# Patient Record
Sex: Female | Born: 1985 | Race: White | Hispanic: No | Marital: Married | Smoking: Former smoker
Health system: Southern US, Community
[De-identification: ages and names within clinical notes are randomized; demographics above are authoritative.]

## PROBLEM LIST (undated history)

## (undated) DIAGNOSIS — G43909 Migraine, unspecified, not intractable, without status migrainosus: Secondary | ICD-10-CM

## (undated) HISTORY — PX: CHOLECYSTECTOMY: SHX55

## (undated) HISTORY — PX: APPENDECTOMY: SHX54

## (undated) HISTORY — PX: DILATION AND EVACUATION: SHX1459

---

## 2015-03-10 NOTE — L&D Delivery Note (Signed)
Delivery Note At 8:26 PM a viable female was delivered via Vaginal, Spontaneous Delivery (Presentation: Left Occiput Anterior).  APGAR: 8, 9; weight  .   Placenta status: Intact, Spontaneous.  Cord: 3 vessels with the following complications: CAN x1 (reduced on perineum) and body cord x1.   Anesthesia: Local Other (Dermaplast) Episiotomy: None Lacerations: 1st degree left (extending from periurethral area to introitus) and right labial Suture Repair: 3.0 chromic Est. Blood Loss (mL): 400  Mom to postpartum.  Baby to Couplet care / Skin to Skin.  Farrel ConnersGUTIERREZ, Mackenzie Ellis 08/17/2015, 9:33 PM

## 2015-08-17 ENCOUNTER — Inpatient Hospital Stay
Admission: EM | Admit: 2015-08-17 | Discharge: 2015-08-19 | DRG: 775 | Disposition: A | Discharge status: Home / Self Care | Payer: BC Managed Care – PPO | Attending: Certified Nurse Midwife | Admitting: Certified Nurse Midwife

## 2015-08-17 DIAGNOSIS — O9912 Other diseases of the blood and blood-forming organs and certain disorders involving the immune mechanism complicating childbirth: Secondary | ICD-10-CM | POA: Diagnosis present

## 2015-08-17 DIAGNOSIS — O99824 Streptococcus B carrier state complicating childbirth: Secondary | ICD-10-CM | POA: Diagnosis present

## 2015-08-17 DIAGNOSIS — Z3A38 38 weeks gestation of pregnancy: Secondary | ICD-10-CM

## 2015-08-17 HISTORY — DX: Migraine, unspecified, not intractable, without status migrainosus: G43.909

## 2015-08-17 LAB — COMPREHENSIVE METABOLIC PANEL
ALK PHOS: 157 U/L — AB (ref 38–126)
ALT: 14 U/L (ref 14–54)
ANION GAP: 12 (ref 5–15)
AST: 19 U/L (ref 15–41)
Albumin: 3.4 g/dL — ABNORMAL LOW (ref 3.5–5.0)
BUN: 6 mg/dL (ref 6–20)
CALCIUM: 9.5 mg/dL (ref 8.9–10.3)
CO2: 19 mmol/L — AB (ref 22–32)
Chloride: 103 mmol/L (ref 101–111)
Creatinine, Ser: 0.68 mg/dL (ref 0.44–1.00)
GFR calc non Af Amer: >60 mL/min (ref >60)
Glucose, Bld: 90 mg/dL (ref 65–99)
POTASSIUM: 3.6 mmol/L (ref 3.5–5.1)
SODIUM: 134 mmol/L — AB (ref 135–145)
Total Bilirubin: 1.1 mg/dL (ref 0.3–1.2)
Total Protein: 6.5 g/dL (ref 6.5–8.1)

## 2015-08-17 LAB — CBC
HEMATOCRIT: 38.8 % (ref 35.0–47.0)
HEMOGLOBIN: 13 g/dL (ref 12.0–16.0)
MCH: 29.7 pg (ref 26.0–34.0)
MCHC: 33.5 g/dL (ref 32.0–36.0)
MCV: 88.6 fL (ref 80.0–100.0)
Platelets: 96 10*3/uL — ABNORMAL LOW (ref 150–440)
RBC: 4.38 MIL/uL (ref 3.80–5.20)
RDW: 13.4 % (ref 11.5–14.5)
WBC: 16.8 10*3/uL — ABNORMAL HIGH (ref 3.6–11.0)

## 2015-08-17 LAB — TYPE AND SCREEN
ABO/RH(D): O POS
ANTIBODY SCREEN: NEGATIVE

## 2015-08-17 MED ORDER — SODIUM CHLORIDE FLUSH 0.9 % IV SOLN
INTRAVENOUS | Status: AC
  Filled 2015-08-17: qty 20

## 2015-08-17 MED ORDER — IBUPROFEN 600 MG PO TABS
600.0000 mg | ORAL_TABLET | Freq: Four times a day (QID) | ORAL | Status: DC | PRN
  Filled 2015-08-17 (×2): qty 1

## 2015-08-17 MED ORDER — ONDANSETRON HCL 4 MG/2ML IJ SOLN
INTRAMUSCULAR | Status: AC
  Administered 2015-08-17: 4 mg via INTRAVENOUS
  Filled 2015-08-17: qty 2

## 2015-08-17 MED ORDER — LACTATED RINGERS IV BOLUS (SEPSIS)
500.0000 mL | Freq: Once | INTRAVENOUS | Status: AC
  Administered 2015-08-17: 500 mL via INTRAVENOUS

## 2015-08-17 MED ORDER — DOCUSATE SODIUM 100 MG PO CAPS
100.0000 mg | ORAL_CAPSULE | Freq: Every day | ORAL | Status: DC
  Administered 2015-08-18 – 2015-08-19 (×2): 100 mg via ORAL
  Filled 2015-08-17 (×2): qty 1

## 2015-08-17 MED ORDER — OXYTOCIN 10 UNIT/ML IJ SOLN
INTRAMUSCULAR | Status: AC
  Filled 2015-08-17: qty 2

## 2015-08-17 MED ORDER — SIMETHICONE 80 MG PO CHEW: 80.0000 mg | CHEWABLE_TABLET | ORAL | Status: DC | PRN

## 2015-08-17 MED ORDER — BENZOCAINE-MENTHOL 20-0.5 % EX AERO
1.0000 "application " | INHALATION_SPRAY | CUTANEOUS | Status: DC | PRN
  Administered 2015-08-18: 1 via TOPICAL

## 2015-08-17 MED ORDER — ONDANSETRON HCL 4 MG PO TABS: 4.0000 mg | ORAL_TABLET | ORAL | Status: DC | PRN

## 2015-08-17 MED ORDER — LACTATED RINGERS IV SOLN: 500.0000 mL | INTRAVENOUS | Status: DC | PRN

## 2015-08-17 MED ORDER — FERROUS SULFATE 325 (65 FE) MG PO TABS
325.0000 mg | ORAL_TABLET | Freq: Every day | ORAL | Status: DC
  Administered 2015-08-18 – 2015-08-19 (×2): 325 mg via ORAL
  Filled 2015-08-17 (×2): qty 1

## 2015-08-17 MED ORDER — ONDANSETRON HCL 4 MG/2ML IJ SOLN
4.0000 mg | Freq: Four times a day (QID) | INTRAMUSCULAR | Status: DC | PRN
  Administered 2015-08-17: 4 mg via INTRAVENOUS

## 2015-08-17 MED ORDER — OXYTOCIN BOLUS FROM INFUSION: 500.0000 mL | INTRAVENOUS | Status: DC

## 2015-08-17 MED ORDER — LIDOCAINE HCL (PF) 1 % IJ SOLN: 30.0000 mL | INTRAMUSCULAR | Status: DC | PRN

## 2015-08-17 MED ORDER — MISOPROSTOL 200 MCG PO TABS
ORAL_TABLET | ORAL | Status: DC
Start: 2015-08-17 — End: 2015-08-17
  Filled 2015-08-17: qty 4

## 2015-08-17 MED ORDER — BENZOCAINE-MENTHOL 20-0.5 % EX AERO
INHALATION_SPRAY | CUTANEOUS | Status: AC
  Administered 2015-08-18: 1 via TOPICAL
  Filled 2015-08-17: qty 56

## 2015-08-17 MED ORDER — ONDANSETRON HCL 4 MG/2ML IJ SOLN: 4.0000 mg | INTRAMUSCULAR | Status: DC | PRN

## 2015-08-17 MED ORDER — FENTANYL 2.5 MCG/ML W/ROPIVACAINE 0.2% IN NS 100 ML EPIDURAL INFUSION (ARMC-ANES)
EPIDURAL | Status: AC
  Filled 2015-08-17: qty 100

## 2015-08-17 MED ORDER — AMMONIA AROMATIC IN INHA: 0.3000 mL | Freq: Once | RESPIRATORY_TRACT | Status: DC | PRN

## 2015-08-17 MED ORDER — AMPICILLIN SODIUM 2 G IJ SOLR
2.0000 g | Freq: Once | INTRAMUSCULAR | Status: AC
  Administered 2015-08-17: 2 g via INTRAVENOUS
  Filled 2015-08-17: qty 2000

## 2015-08-17 MED ORDER — AMMONIA AROMATIC IN INHA
RESPIRATORY_TRACT | Status: AC
  Filled 2015-08-17: qty 10

## 2015-08-17 MED ORDER — MISOPROSTOL 200 MCG PO TABS
800.0000 ug | ORAL_TABLET | Freq: Once | ORAL | Status: DC | PRN
  Filled 2015-08-17: qty 4

## 2015-08-17 MED ORDER — DIBUCAINE 1 % RE OINT: 1.0000 "application " | TOPICAL_OINTMENT | RECTAL | Status: DC | PRN

## 2015-08-17 MED ORDER — LIDOCAINE HCL (PF) 1 % IJ SOLN
INTRAMUSCULAR | Status: AC
  Filled 2015-08-17: qty 30

## 2015-08-17 MED ORDER — OXYTOCIN 40 UNITS IN LACTATED RINGERS INFUSION - SIMPLE MED
2.5000 [IU]/h | INTRAVENOUS | Status: DC
  Administered 2015-08-17: 2.5 [IU]/h via INTRAVENOUS
  Filled 2015-08-17: qty 1000

## 2015-08-17 MED ORDER — SODIUM CHLORIDE 0.9 % IV SOLN
1.0000 g | INTRAVENOUS | Status: DC
  Administered 2015-08-17: 1 g via INTRAVENOUS
  Filled 2015-08-17 (×5): qty 1000

## 2015-08-17 MED ORDER — OXYCODONE HCL 5 MG PO TABS: 5.0000 mg | ORAL_TABLET | ORAL | Status: DC | PRN

## 2015-08-17 MED ORDER — COCONUT OIL OIL
1.0000 "application " | TOPICAL_OIL | Status: DC | PRN
  Filled 2015-08-17: qty 120

## 2015-08-17 MED ORDER — PRENATAL MULTIVITAMIN CH
1.0000 | ORAL_TABLET | Freq: Every day | ORAL | Status: DC
  Administered 2015-08-18: 1 via ORAL
  Filled 2015-08-17 (×2): qty 1

## 2015-08-17 MED ORDER — LACTATED RINGERS IV SOLN: INTRAVENOUS | Status: DC

## 2015-08-17 MED ORDER — WITCH HAZEL-GLYCERIN EX PADS: 1.0000 "application " | MEDICATED_PAD | CUTANEOUS | Status: DC | PRN

## 2015-08-17 NOTE — Progress Notes (Signed)
L&D Progress Note   S: Had used the N2O2 for pain but did not feel it was helping her. Asked for a epidural  O: 147/96 FHR: 145 with accelerations to 170s, moderate variability Toco: q2-4 min apart with some coupling Cervix: 7/90%/-1 Results for orders placed or performed during the hospital encounter of 08/17/15 (from the past 24 hour(s))  Comprehensive metabolic panel     Status: Abnormal   Collection Time: 08/17/15  1:10 PM  Result Value Ref Range   Sodium 134 (L) 135 - 145 mmol/L   Potassium 3.6 3.5 - 5.1 mmol/L   Chloride 103 101 - 111 mmol/L   CO2 19 (L) 22 - 32 mmol/L   Glucose, Bld 90 65 - 99 mg/dL   BUN 6 6 - 20 mg/dL   Creatinine, Ser 1.610.68 0.44 - 1.00 mg/dL   Calcium 9.5 8.9 - 09.610.3 mg/dL   Total Protein 6.5 6.5 - 8.1 g/dL   Albumin 3.4 (L) 3.5 - 5.0 g/dL   AST 19 15 - 41 U/L   ALT 14 14 - 54 U/L   Alkaline Phosphatase 157 (H) 38 - 126 U/L   Total Bilirubin 1.1 0.3 - 1.2 mg/dL   GFR calc non Af Amer >60 >60 mL/min   GFR calc Af Amer >60 >60 mL/min   Anion gap 12 5 - 15  CBC     Status: Abnormal   Collection Time: 08/17/15  2:02 PM  Result Value Ref Range   WBC 16.8 (H) 3.6 - 11.0 K/uL   RBC 4.38 3.80 - 5.20 MIL/uL   Hemoglobin 13.0 12.0 - 16.0 g/dL   HCT 04.538.8 40.935.0 - 81.147.0 %   MCV 88.6 80.0 - 100.0 fL   MCH 29.7 26.0 - 34.0 pg   MCHC 33.5 32.0 - 36.0 g/dL   RDW 91.413.4 78.211.5 - 95.614.5 %   Platelets 96 (L) 150 - 440 K/uL  Type and screen [facility]     Status: None   Collection Time: 08/17/15  2:02 PM  Result Value Ref Range   ABO/RH(D) O POS    Antibody Screen NEG    Sample Expiration 08/20/2015    A: Progressing in active labor Mild thrombocytopenia Mildly elevated blood pressures-possible gestational hypertension Cat 1 FHR tracing  P: Anesthesia consulted for epidural, but declined doing epidural due to plt count<100K. All other preeclampsia labs were WNL. Patient declines analgesia Monitor BPs closely for severe range blood  pressures.me Farrel ConnersGUTIERREZ, Jaiyah Beining, CNM

## 2015-08-17 NOTE — OB Triage Note (Signed)
G1P0 patient came in with contractions every 2-3 mins per patient. Ctxs began around 6am this morning. Reports positive fetal movement.

## 2015-08-17 NOTE — H&P (Signed)
OB History & Physical   History of Present Illness:  Chief Complaint:  Complains of strong regular contractions since 0600 this Am. Has also had nausea and vomiting this AM. Has not eaten since last night. HPI:  Kelly SplinterKatherine Mcclatchy is a 30 y.o. G2P0010 female at 4970w1d dated by LMP and c/w a 7wk3d ultrasound.  Her pregnancy has been complicated by an elevated one hour GTT with a normal 3hr GTT and GBS positive..  She presents to L&D for evaluation of labor. Has had a bloody show, but no frank bleeding or ROM.  Prenatal care site: [facility]. Plans on breast feeding. TDAP UTD      Maternal Medical History:   Past Medical History  Diagnosis Date  . Migraine     Past Surgical History  Procedure Laterality Date  . Appendectomy    . Dilation and evacuation      EAB in second trimester    No Known Allergies  Prior to Admission medications   Medication Sig Start Date End Date Taking? Authorizing Provider  Prenatal Vit-Fe Fumarate-FA (PRENATAL MULTIVITAMIN) TABS tablet Take 1 tablet by mouth daily at 12 noon.   Yes Historical Provider, MD          Social History: She  reports that she has never smoked. She does not have any smokeless tobacco history on file. She reports that she does not drink alcohol or use illicit drugs.  Family History: family history is not on file.   Review of Systems: Negative x 10 systems reviewed except as noted in the HPI.      Physical Exam:  Vital Signs: BP 135/93 mmHg  Pulse 80  Temp(Src) 98.3 F (36.8 C) (Oral)  Resp 18  Ht 5\' 7"  (1.702 m)  Wt 73.936 kg (163 lb)  BMI 25.52 kg/m2  LMP 11/23/2014 (LMP Unknown) General: no acute distress.  HEENT: normocephalic, atraumatic Heart: regular rate & rhythm.  No murmurs Lungs: clear to auscultation bilaterally Abdomen: soft, gravid, non-tender;  EFW: 7# Pelvic:   External: Normal external female genitalia  Cervix: Dilation: 4.5 / Effacement (%): 90 / Station: -1   Extremities:  non-tender, symmetric, no edema bilaterally.  DTRs: +2 Neurologic: Alert & oriented x 3.    Pertinent Results:  Prenatal Labs: Blood type/Rh O positive  Antibody screen negative  Rubella Varicella Immune immune  RPR negative  HBsAg negative  HIV negative  GC negative  Chlamydia negative  Genetic screening declined  1 hour GTT 157  3 hour GTT 77/158/137/121  GBS positive on 5/26   Baseline FHR: 155 with accelerations to 170s, moderate variability Toco: contractions q2 min apart  Assessment:  Kelly SplinterKatherine Dudzik is a 30 y.o. G2P0010 female at 6270w1d in active labor  Mild range blood pressure on admission-possibly from pain, R/O GHTN vs Preeclampsia  Plan:  1. Admit to Labor & Delivery   2. CBC, T&S, Clrs, IVF, CMP 3. GBS positive-start GBS PPX.   4. Consents obtained. 5. Discussed analgesia and anesthesia available-wants to try to deliver without medication. 6. Can ambulate/ sit in birthing ball, take shower  Armelia Penton  08/17/2015 12:59 PM

## 2015-08-18 LAB — CBC
HEMATOCRIT: 35.8 % (ref 35.0–47.0)
HEMOGLOBIN: 12.2 g/dL (ref 12.0–16.0)
MCH: 30.2 pg (ref 26.0–34.0)
MCHC: 34 g/dL (ref 32.0–36.0)
MCV: 88.9 fL (ref 80.0–100.0)
Platelets: 124 10*3/uL — ABNORMAL LOW (ref 150–440)
RBC: 4.03 MIL/uL (ref 3.80–5.20)
RDW: 13.1 % (ref 11.5–14.5)
WBC: 24.4 10*3/uL — ABNORMAL HIGH (ref 3.6–11.0)

## 2015-08-18 LAB — RPR: RPR: NONREACTIVE

## 2015-08-18 NOTE — Discharge Instructions (Signed)
Vaginal Delivery, Care After Refer to this sheet in the next few weeks. These discharge instructions provide you with information on caring for yourself after delivery. Your caregiver may also give you specific instructions. Your treatment has been planned according to the most current medical practices available, but problems sometimes occur. Call your caregiver if you have any problems or questions after you go home. HOME CARE INSTRUCTIONS 1. Take over-the-counter or prescription medicines only as directed by your caregiver or pharmacist. 2. Do not drink alcohol, especially if you are breastfeeding or taking medicine to relieve pain. 3. Do not smoke tobacco. 4. Continue to use good perineal care. Good perineal care includes: 1. Wiping your perineum from back to front 2. Keeping your perineum clean. 3. You can do sitz baths twice a day, to help keep this area clean 5. Do not use tampons, douche or have sex until your caregiver says it is okay. 6. Shower only and avoid sitting in submerged water, aside from sitz baths 7. Wear a well-fitting bra that provides breast support. 8. Eat healthy foods. 9. Drink enough fluids to keep your urine clear or pale yellow. 10. Eat high-fiber foods such as whole grain cereals and breads, brown rice, beans, and fresh fruits and vegetables every day. These foods may help prevent or relieve constipation. 11. Avoid constipation with high fiber foods or medications, such as miralax or metamucil 12. Follow your caregiver's recommendations regarding resumption of activities such as climbing stairs, driving, lifting, exercising, or traveling. 13. Talk to your caregiver about resuming sexual activities. Resumption of sexual activities is dependent upon your risk of infection, your rate of healing, and your comfort and desire to resume sexual activity. 14. Try to have someone help you with your household activities and your newborn for at least a few days after you leave  the hospital. 15. Rest as much as possible. Try to rest or take a nap when your newborn is sleeping. 16. Increase your activities gradually. 17. Keep all of your scheduled postpartum appointments. It is very important to keep your scheduled follow-up appointments. At these appointments, your caregiver will be checking to make sure that you are healing physically and emotionally. SEEK MEDICAL CARE IF:   You are passing large clots from your vagina. Save any clots to show your caregiver.  You have a foul smelling discharge from your vagina.  You have trouble urinating.  You are urinating frequently.  You have pain when you urinate.  You have a change in your bowel movements.  You have increasing redness, pain, or swelling near your vaginal incision (episiotomy) or vaginal tear.  You have pus draining from your episiotomy or vaginal tear.  Your episiotomy or vaginal tear is separating.  You have painful, hard, or reddened breasts.  You have a severe headache.  You have blurred vision or see spots.  You feel sad or depressed.  You have thoughts of hurting yourself or your newborn.  You have questions about your care, the care of your newborn, or medicines.  You are dizzy or light-headed.  You have a rash.  You have nausea or vomiting.  You were breastfeeding and have not had a menstrual period within 12 weeks after you stopped breastfeeding.  You are not breastfeeding and have not had a menstrual period by the 12th week after delivery.  You have a fever. SEEK IMMEDIATE MEDICAL CARE IF:   You have persistent pain.  You have chest pain.  You have shortness of breath.    You faint.  You have leg pain.  You have stomach pain.  Your vaginal bleeding saturates two or more sanitary pads in 1 hour. MAKE SURE YOU:   Understand these instructions.  Will watch your condition.  Will get help right away if you are not doing well or get worse. Document Released:  02/21/2000 Document Revised: 07/10/2013 Document Reviewed: 10/21/2011 ExitCare Patient Information 2015 [employer]. This information is not intended to replace advice given to you by your health care provider. Make sure you discuss any questions you have with your health care provider.  Sitz Bath A sitz bath is a warm water bath taken in the sitting position. The water covers only the hips and butt (buttocks). We recommend using one that fits in the toilet, to help with ease of use and cleanliness. It may be used for either healing or cleaning purposes. Sitz baths are also used to relieve pain, itching, or muscle tightening (spasms). The water may contain medicine. Moist heat will help you heal and relax.  HOME CARE  Take 3 to 4 sitz baths a day. 18. Fill the bathtub half-full with warm water. 19. Sit in the water and open the drain a little. 20. Turn on the warm water to keep the tub half-full. Keep the water running constantly. 21. Soak in the water for 15 to 20 minutes. 22. After the sitz bath, pat the affected area dry. GET HELP RIGHT AWAY IF: You get worse instead of better. Stop the sitz baths if you get worse. MAKE SURE YOU:  Understand these instructions.  Will watch your condition.  Will get help right away if you are not doing well or get worse. Document Released: 04/02/2004 Document Revised: 11/18/2011 Document Reviewed: 06/23/2010 ExitCare Patient Information 2015 [employer]. This information is not intended to replace advice given to you by your health care provider. Make sure you discuss any questions you have with your health care provider.    

## 2015-08-18 NOTE — Progress Notes (Signed)
L&D Progress Note Post Partum Day1 Subjective: no complaints, up ad lib, voiding, tolerating PO and breastfeeding baby  Objective: Blood pressure 106/72, pulse 73, temperature 98.1 F (36.7 C), temperature source Oral, resp. rate 20, height 5\' 7"  (1.702 m), weight 73.936 kg (163 lb), last menstrual period 11/23/2014, SpO2 98 %, unknown if currently breastfeeding.  Physical Exam:  General: alert, cooperative and no distress Lochia: appropriate Uterine Fundus: firm Incision:no hematoma, healing well DVT Evaluation: No evidence of DVT seen on physical exam.   Recent Labs  08/17/15 1402 08/18/15 0505  HGB 13.0 12.2  HCT 38.8 35.8  WBC 16.8* 24.4*  PLT 96* 124*    Assessment/Plan: Stable PPD #1 Plan for discharge tomorrow  Continue postpartum care O POS/ RI/ VI Breast/ [place] TDAP UTD  Mackenzie Ellis, CNM   LOS: 1 day   Mackenzie Ellis 08/18/2015, 1:03 PM

## 2015-08-18 NOTE — Discharge Summary (Signed)
  Physician Obstetric Discharge Summary  Patient ID: Mackenzie Ellis MRN: [id number] DOB/AGE: [birth year] 32 y.o.   Date of Admission: 08/17/2015  Date of Discharge:   Admitting Diagnosis: Onset of Labor at 226w1d  Secondary Diagnosis: None  Mode of Delivery: normal spontaneous vaginal delivery 08/17/2015      Discharge Diagnosis: term pregnancy delivered, gestational thrombocytopenia   Intrapartum Procedures: GBS prophylaxis/repair of labial lacerations bilaterally   Post partum procedures: Routine  Complications: none   Brief Hospital Course  Mackenzie SplinterKatherine Cosgriff is a [id number] who had a SVD on 08/17/2015;  for further details of this delivery, please refer to the delivery note.  Patient had an uncomplicated postpartum course.  By time of discharge on PPD#2, her pain was controlled on oral pain medications; she had appropriate lochia and was ambulating, voiding without difficulty and tolerating regular diet.  She was deemed stable for discharge to home.    *Labs: CBC Latest Ref Rng 08/18/2015 08/17/2015  WBC 3.6 - 11.0 K/uL 24.4(H) 16.8(H)  Hemoglobin 12.0 - 16.0 g/dL 44.012.2 10.213.0  Hematocrit 72.535.0 - 47.0 % 35.8 38.8  Platelets 150 - 440 K/uL 124(L) 96(L)   O POS  Physical exam:  Blood pressure 116/76, pulse 76, temperature 98.8 F (37.1 C), temperature source Oral, resp. rate 18, height 5\' 7"  (1.702 m), weight 73.936 kg (163 lb), last menstrual period 11/23/2014, SpO2 98 %, unknown if currently breastfeeding. General: alert and no distress Lochia: appropriate Abdomen: soft, NT Uterine Fundus: firm Extremities: No evidence of DVT seen on physical exam. No lower extremity edema.  Discharge Instructions: Per After Visit Summary. Activity: Advance as tolerated. Pelvic rest for 6 weeks.  Also refer to Discharge Instructions Diet: Regular Medications:   Medication List    ASK your doctor about these medications        prenatal multivitamin Tabs tablet  Take 1 tablet by mouth daily  at 12 noon.          Birth Control Pill, Start in 2 weeks (June 26).  Outpatient follow up:  Postpartum contraception: oral progesterone-only contraceptive  Discharged Condition: good  Discharged to: home   Newborn Data: Disposition:home with mother  Apgars: APGAR (1 MIN): 8   APGAR (5 MINS): 9   Baby Feeding: Breast  Mackenzie Ellis, Mackenzie Ellis, CNM 08/18/2015 10:17 AM  Mackenzie MajorPaul Harris, MD

## 2015-08-19 MED ORDER — NORETHINDRONE 0.35 MG PO TABS: 1.0000 | ORAL_TABLET | Freq: Every day | ORAL | Status: DC

## 2015-08-19 NOTE — Progress Notes (Signed)
L&D Progress Note Post Partum Day2 Subjective: no complaints, up ad lib, voiding, tolerating PO and breastfeeding baby  Objective: Blood pressure 113/70, pulse 71, temperature 98 F (36.7 C), temperature source Oral, resp. rate 18, height 5\' 7"  (1.702 m), weight 73.936 kg (163 lb), last menstrual period 11/23/2014, SpO2 100 %, unknown if currently breastfeeding.  Physical Exam:  General: alert, cooperative and no distress Lochia: appropriate Uterine Fundus: firm Incision:no hematoma, healing well DVT Evaluation: No evidence of DVT seen on physical exam.   Recent Labs  08/17/15 1402 08/18/15 0505  HGB 13.0 12.2  HCT 38.8 35.8  WBC 16.8* 24.4*  PLT 96* 124*    Assessment/Plan: Stable PPD #2 Continue postpartum care O POS/ RI/ VI Breast/ [place] TDAP UTD [facility]  Letitia Libraobert Paul Harris, MD   LOS: 2 days   Letitia LibraRobert Paul Harris 08/19/2015, 9:43 AM

## 2015-08-19 NOTE — Progress Notes (Signed)
Discharged instructions reviewed with pt and husband.

## 2015-08-19 NOTE — Progress Notes (Signed)
Discharged to home; to car via auxillary; Rx. Given to patient

## 2015-11-25 ENCOUNTER — Ambulatory Visit
Admission: RE | Admit: 2015-11-25 | Discharge: 2015-11-25 | Disposition: A | Discharge status: Home / Self Care | Payer: BC Managed Care – PPO | Source: Ambulatory Visit | Attending: Counselor | Admitting: Counselor

## 2015-11-25 ENCOUNTER — Other Ambulatory Visit: Payer: Self-pay | Admitting: Counselor

## 2015-11-25 DIAGNOSIS — N3289 Other specified disorders of bladder: Secondary | ICD-10-CM | POA: Diagnosis not present

## 2015-11-25 DIAGNOSIS — N200 Calculus of kidney: Secondary | ICD-10-CM | POA: Diagnosis not present

## 2015-11-25 DIAGNOSIS — R109 Unspecified abdominal pain: Secondary | ICD-10-CM

## 2015-11-25 DIAGNOSIS — R319 Hematuria, unspecified: Secondary | ICD-10-CM

## 2015-11-29 ENCOUNTER — Ambulatory Visit (INDEPENDENT_AMBULATORY_CARE_PROVIDER_SITE_OTHER): Payer: BC Managed Care – PPO | Admitting: Urology

## 2015-11-29 ENCOUNTER — Encounter: Payer: Self-pay | Admitting: Urology

## 2015-11-29 VITALS — BP 114/77 | HR 71 | Ht 67.0 in | Wt 139.6 lb

## 2015-11-29 DIAGNOSIS — N200 Calculus of kidney: Secondary | ICD-10-CM | POA: Diagnosis not present

## 2015-11-29 LAB — URINALYSIS, COMPLETE
BILIRUBIN UA: NEGATIVE
Glucose, UA: NEGATIVE
KETONES UA: NEGATIVE
Nitrite, UA: NEGATIVE
PH UA: 6 (ref 5.0–7.5)
PROTEIN UA: NEGATIVE
RBC UA: NEGATIVE
SPEC GRAV UA: 1.02 (ref 1.005–1.030)
Urobilinogen, Ur: 0.2 mg/dL (ref 0.2–1.0)

## 2015-11-29 LAB — MICROSCOPIC EXAMINATION: BACTERIA UA: NONE SEEN

## 2015-11-29 NOTE — Progress Notes (Signed)
11/29/2015 9:09 AM   [name] 1985/12/05 [id number]  Referring provider: [facility] [address] [place], [place] [address]  Chief Complaint  Patient presents with  . New Patient (Initial Visit)    Nephrolithiasis     HPI: The patient is a 30 year old female who recently passed ureteral stone. She also has bilateral stones approximate 2 in her right kidney that are 4-5 mm in size was 1 left kidney.  She passed her stone as it for analysis today. She is never had kidney stones before in the past. She recently had a child approximately 4 months ago. Her father does have a history of nephrolithiasis. She was placed this time. Her pain has resolved.  PMH: Past Medical History:  Diagnosis Date  . Migraine     Surgical History: Past Surgical History:  Procedure Laterality Date  . APPENDECTOMY    . CHOLECYSTECTOMY    . DILATION AND EVACUATION     EAB in second trimester    Home Medications:    Medication List       Accurate as of 11/29/15  9:09 AM. Always use your most recent med list.          norethindrone 0.35 MG tablet Commonly known as:  MICRONOR,CAMILA,ERRIN Take 1 tablet (0.35 mg total) by mouth daily.   prenatal multivitamin Tabs tablet Take 1 tablet by mouth daily at 12 noon.       Allergies: No Known Allergies  Family History: Family History  Problem Relation Age of Onset  . Nephrolithiasis Father   . Hematuria Father   . Diabetes Father   . Prostate cancer Father     Social History:  reports that she quit smoking about 3 years ago. She has never used smokeless tobacco. She reports that she does not drink alcohol or use drugs.  ROS: UROLOGY Frequent Urination?: No Hard to postpone urination?: No Burning/pain with urination?: Yes Get up at night to urinate?: No Leakage of urine?: No Urine stream starts and stops?: No Trouble starting stream?: No Do you have to strain to urinate?: No Blood in urine?:  Yes Urinary tract infection?: No Sexually transmitted disease?: No Injury to kidneys or bladder?: No Painful intercourse?: No Weak stream?: Yes Currently pregnant?: No Vaginal bleeding?: No Last menstrual period?: No  Gastrointestinal Nausea?: Yes Vomiting?: Yes Indigestion/heartburn?: No Diarrhea?: No Constipation?: No  Constitutional Fever: Yes Night sweats?: Yes Weight loss?: No Fatigue?: No  Skin Skin rash/lesions?: No Itching?: No  Eyes Blurred vision?: No Double vision?: No  Ears/Nose/Throat Sore throat?: No Sinus problems?: No  Hematologic/Lymphatic Swollen glands?: No Easy bruising?: No  Cardiovascular Leg swelling?: No Chest pain?: No  Respiratory Cough?: No Shortness of breath?: No  Endocrine Excessive thirst?: No  Musculoskeletal Back pain?: No Joint pain?: No  Neurological Headaches?: Yes Dizziness?: No  Psychologic Depression?: No Anxiety?: No  Physical Exam: BP 114/77   Pulse 71   Ht 5\' 7"  (1.702 m)   Wt 139 lb 9.6 oz (63.3 kg)   LMP 11/08/2014   Breastfeeding? Yes   BMI 21.86 kg/m   Constitutional:  Alert and oriented, No acute distress. HEENT: [place] AT, moist mucus membranes.  Trachea midline, no masses. Cardiovascular: No clubbing, cyanosis, or edema. Respiratory: Normal respiratory effort, no increased work of breathing. GI: Abdomen is soft, nontender, nondistended, no abdominal masses GU: No CVA tenderness.  Skin: No rashes, bruises or suspicious lesions. Lymph: No cervical or inguinal adenopathy. Neurologic: Grossly intact, no focal deficits, moving all  4 extremities. Psychiatric: Normal mood and affect.  Laboratory Data: Lab Results  Component Value Date   WBC 24.4 (H) 08/18/2015   HGB 12.2 08/18/2015   HCT 35.8 08/18/2015   MCV 88.9 08/18/2015   PLT 124 (L) 08/18/2015    Lab Results  Component Value Date   CREATININE 0.68 08/17/2015    No results found for: PSA  No results found for:  TESTOSTERONE  No results found for: HGBA1C  Urinalysis No results found for: COLORURINE, APPEARANCEUR, LABSPEC, PHURINE, GLUCOSEU, HGBUR, BILIRUBINUR, KETONESUR, PROTEINUR, UROBILINOGEN, NITRITE, LEUKOCYTESUR  Pertinent Imaging: CLINICAL DATA:  Right flank pain for 2 days, decreased urine output  EXAM: CT ABDOMEN AND PELVIS WITHOUT CONTRAST  TECHNIQUE: Multidetector CT imaging of the abdomen and pelvis was performed following the standard protocol without IV contrast.  COMPARISON:  None.  FINDINGS: Lower chest: The lung bases are clear.  Hepatobiliary: The liver is unremarkable in the unenhanced state. No ductal dilatation is seen. No calcified gallstones are noted.  Pancreas: The pancreas is normal in size on this unenhanced study and the pancreatic duct is not dilated.  Spleen: The spleen is unremarkable.  Adrenals/Urinary Tract: The adrenal glands appear normal. There are bilateral renal calculi present. The largest cyst in the upper pole of the right kidney measuring 5 mm in diameter. No hydronephrosis is currently seen. However, there is a calcification which on multiplanar images appears to be with in the nondistended urinary bladder. This calculus measures 5 mm in diameter. The ureters are not dilated.  Stomach/Bowel: The stomach is largely decompressed. No dilatation of large or small bowel is seen. There is feces throughout the colon. Surgical clips are noted near the base of the cecum which may be due prior appendectomy.  Vascular/Lymphatic: The abdominal aorta is normal in caliber although are not well seen on this unenhanced study. No adenopathy is noted.  Reproductive: The uterus is not well seen on this unenhanced study. No definite adnexal lesion is noted. A small amount of free fluid layers in the cul de sac.  Other: None  Musculoskeletal: The lumbar vertebrae are in normal alignment with normal intervertebral disc  spaces.  IMPRESSION: 1. There is a 5 mm calcification which appears to be within the urinary bladder consistent with a recently passed calculus. 2. Multiple bilateral renal calculi, the largest in the upper pole of the right kidney measuring 5 mm in diameter. No present hydronephrosis. 3. Small amount of free fluid in the cul-de-sac.  Assessment & Plan:    1. Nephrolithiasis -The patient passed her ureteral calculus. We will send it off for analysis. We will call her with results. -She does have bilateral nonobstructing stones. There are approximately 3 stones in total.She was in her right and left kidney. I discussed options with her which included active surveillance versus definitive management via ureteroscopy. She has a newborn child is not interested in undergoing surgery at this time as she is asymptomatic. She will undergo active surveillance. She does understand the risks include need for emergent treatment if her stones to pass or if she has an infection while passing a stone. I do think active surveillance is a reasonable option given her social needs of caring for her newborn child. She'll follow-up in one year with KUB prior.   Return in about 1 year (around 11/28/2016) for with KUB prior.  Hildred LaserBrian James Airi Copado, MD  [facility] [address], [address] [place], [place] [address] [phone]

## 2015-12-02 LAB — CULTURE, URINE COMPREHENSIVE

## 2015-12-12 ENCOUNTER — Other Ambulatory Visit: Payer: Self-pay | Admitting: Urology

## 2015-12-25 ENCOUNTER — Telehealth: Payer: Self-pay

## 2015-12-25 NOTE — Telephone Encounter (Signed)
Mackenzie LaserBrian James Budzyn, MD  Skeet Latchhelsea C Watkins, LPN        Please let patient know that her stone was calcium oxalate and calcium phosphate based. The calcium phosphate component is likely related to her recent pregnancy. The biggest thing she can do to prevent further stones is to drink eight 8 oz glasses per day.    LMOM

## 2015-12-27 NOTE — Telephone Encounter (Signed)
Spoke with pt in reference to stone analysis. Pt voiced understanding.  

## 2016-07-24 ENCOUNTER — Other Ambulatory Visit: Payer: Self-pay | Admitting: Obstetrics & Gynecology

## 2016-07-24 DIAGNOSIS — Z30011 Encounter for initial prescription of contraceptive pills: Secondary | ICD-10-CM

## 2016-11-04 ENCOUNTER — Ambulatory Visit: Payer: Self-pay | Admitting: Obstetrics & Gynecology

## 2016-11-15 ENCOUNTER — Telehealth: Payer: Self-pay | Admitting: Obstetrics & Gynecology

## 2016-11-15 DIAGNOSIS — Z30011 Encounter for initial prescription of contraceptive pills: Secondary | ICD-10-CM

## 2016-11-16 NOTE — Telephone Encounter (Signed)
Sch Annual Exam appt PH please. Refill sent to pharmacy for this month.

## 2016-11-16 NOTE — Telephone Encounter (Signed)
Called and left voicemail for patient to call back to be schedule for annual

## 2016-11-27 ENCOUNTER — Ambulatory Visit: Payer: BC Managed Care – PPO

## 2016-12-09 ENCOUNTER — Other Ambulatory Visit: Payer: Self-pay | Admitting: Radiology

## 2016-12-09 ENCOUNTER — Other Ambulatory Visit: Payer: Self-pay | Admitting: Urology

## 2016-12-09 ENCOUNTER — Ambulatory Visit
Admission: RE | Admit: 2016-12-09 | Discharge: 2016-12-09 | Disposition: A | Discharge status: Home / Self Care | Payer: BC Managed Care – PPO | Source: Ambulatory Visit | Attending: Urology | Admitting: Urology

## 2016-12-09 DIAGNOSIS — N200 Calculus of kidney: Secondary | ICD-10-CM | POA: Insufficient documentation

## 2016-12-14 ENCOUNTER — Other Ambulatory Visit: Payer: Self-pay | Admitting: Obstetrics & Gynecology

## 2016-12-14 DIAGNOSIS — Z30011 Encounter for initial prescription of contraceptive pills: Secondary | ICD-10-CM

## 2016-12-16 ENCOUNTER — Ambulatory Visit (INDEPENDENT_AMBULATORY_CARE_PROVIDER_SITE_OTHER): Payer: BC Managed Care – PPO | Admitting: Urology

## 2016-12-16 ENCOUNTER — Encounter: Payer: Self-pay | Admitting: Urology

## 2016-12-16 VITALS — BP 151/82 | HR 73 | Ht 67.0 in | Wt 136.7 lb

## 2016-12-16 DIAGNOSIS — N200 Calculus of kidney: Secondary | ICD-10-CM | POA: Diagnosis not present

## 2016-12-16 LAB — URINALYSIS, COMPLETE
BILIRUBIN UA: NEGATIVE
GLUCOSE, UA: NEGATIVE
KETONES UA: NEGATIVE
Nitrite, UA: NEGATIVE
PH UA: 6 (ref 5.0–7.5)
PROTEIN UA: NEGATIVE
RBC UA: NEGATIVE
UUROB: 0.2 mg/dL (ref 0.2–1.0)

## 2016-12-16 LAB — MICROSCOPIC EXAMINATION: RBC, UA: NONE SEEN /hpf (ref 0–?)

## 2016-12-16 NOTE — Progress Notes (Signed)
12/16/2016 8:52 AM   Mackenzie Ellis [birth year] [id number]  Referring provider: Center, [facility] [address] [place], [place] [address]  Chief Complaint  Patient presents with  . Follow-up    KUB results    HPI: F/u kidney stones. She passed a stone in 2017. The stone was CaOx/CaPhos. She also has bilateral stones approximate 3 in her right kidney that are 4-5 mm in size and 1 left kidney stone.    She returns and has been well. KUB 12/09/2016 shows 2 stones in the right kidney one in the upper pole about 4 mm and one in the lower pole about 4 mm. No left stones were seen. I reviewed the images. I also reviewed her prior CT scan and her stone composition report. She may have passed a stone 3 weeks ago. She had pain on the right which improved with stretching. Her UA is clear.     PMH: Past Medical History:  Diagnosis Date  . Migraine     Surgical History: Past Surgical History:  Procedure Laterality Date  . APPENDECTOMY    . CHOLECYSTECTOMY    . DILATION AND EVACUATION     EAB in second trimester    Home Medications:  Allergies as of 12/16/2016   No Known Allergies     Medication List       Accurate as of 12/16/16  8:52 AM. Always use your most recent med list.          NORLYDA 0.35 MG tablet Generic drug:  norethindrone TAKE 1 TABLET BY MOUTH DAILY   prenatal multivitamin Tabs tablet Take 1 tablet by mouth daily at 12 noon.       Allergies: No Known Allergies  Family History: Family History  Problem Relation Age of Onset  . Nephrolithiasis Father   . Hematuria Father   . Diabetes Father   . Prostate cancer Father     Social History:  reports that she quit smoking about 4 years ago. She has never used smokeless tobacco. She reports that she does not drink alcohol or use drugs.  ROS: UROLOGY Frequent Urination?: No Hard to postpone urination?: No Burning/pain with urination?: No Get up at night to urinate?: No Leakage  of urine?: No Urine stream starts and stops?: No Trouble starting stream?: No Do you have to strain to urinate?: No Blood in urine?: No Urinary tract infection?: No Sexually transmitted disease?: No Injury to kidneys or bladder?: No Painful intercourse?: No Weak stream?: No Currently pregnant?: No Vaginal bleeding?: No Last menstrual period?: n  Gastrointestinal Nausea?: No Vomiting?: No Indigestion/heartburn?: No Diarrhea?: No Constipation?: No  Constitutional Fever: No Night sweats?: No Weight loss?: No Fatigue?: No  Skin Skin rash/lesions?: No Itching?: No  Eyes Blurred vision?: No Double vision?: No  Ears/Nose/Throat Sore throat?: No Sinus problems?: No  Hematologic/Lymphatic Swollen glands?: No Easy bruising?: No  Cardiovascular Leg swelling?: No Chest pain?: No  Respiratory Cough?: No Shortness of breath?: No  Endocrine Excessive thirst?: No  Musculoskeletal Back pain?: No Joint pain?: No  Neurological Headaches?: No Dizziness?: No  Psychologic Depression?: No Anxiety?: No  Physical Exam: BP (!) 151/82 (BP Location: Right Arm, Patient Position: Sitting, Cuff Size: Normal)   Pulse 73   Ht  (1.702 m)   Wt 62 kg (136 lb 11.2 oz)   BMI 21.41 kg/m   Constitutional:  Alert and oriented, No acute distress. HEENT: [place] AT, moist mucus membranes.  Trachea midline, no masses. Cardiovascular: No clubbing, cyanosis,  or edema. Respiratory: Normal respiratory effort, no increased work of breathing. GI: Abdomen is soft, nontender, nondistended, no abdominal masses GU: No CVA tenderness. Skin: No rashes, bruises or suspicious lesions. Lymph: No cervical or inguinal adenopathy. Neurologic: Grossly intact, no focal deficits, moving all 4 extremities. Psychiatric: Normal mood and affect.  Laboratory Data: Lab Results  Component Value Date   WBC 24.4 (H) 08/18/2015   HGB 12.2 08/18/2015   HCT 35.8 08/18/2015   MCV 88.9 08/18/2015   PLT  124 (L) 08/18/2015    Lab Results  Component Value Date   CREATININE 0.68 08/17/2015    No results found for: PSA1  No results found for: TESTOSTERONE  No results found for: HGBA1C  Urinalysis Lab Results  Component Value Date   SPECGRAV 1.020 11/29/2015   PHUR 6.0 11/29/2015   COLORU Yellow 11/29/2015   APPEARANCEUR Clear 11/29/2015   LEUKOCYTESUR Trace (A) 11/29/2015   PROTEINUR Negative 11/29/2015   GLUCOSEU Negative 11/29/2015   KETONESU Negative 11/29/2015   RBCU Negative 11/29/2015   BILIRUBINUR Negative 11/29/2015   UUROB 0.2 11/29/2015   NITRITE Negative 11/29/2015    Lab Results  Component Value Date   LABMICR See below: 11/29/2015   WBCUA 0-5 11/29/2015   RBCUA 0-2 11/29/2015   LABEPIT 0-10 11/29/2015   BACTERIA None seen 11/29/2015    Pertinent Imaging: KUB and CT  Results for orders placed during the hospital encounter of 12/09/16  DG Abd 1 View   Narrative CLINICAL DATA:  Nephrolithiasis  EXAM: ABDOMEN - 1 VIEW  COMPARISON:  Abdominal CT 11/25/2015  FINDINGS: 3 mm stone overlapping the upper right renal shadow. 4 mm stone overlapping the lower right renal shadow. Calcification in the right pelvis correlates with arterial calcifications seen previously. A right UVJ calculus noted on comparison CT is not visible today. Right lower quadrant surgical clips. Normal bowel gas pattern.  IMPRESSION: Two right renal calculi measuring up to 4 mm. A left renal calculus seen on CT 11/25/2015 is not visible.   Electronically Signed   By: Marnee Spring M.D.   On: 12/09/2016 11:03    No results found for this or any previous visit. No results found for this or any previous visit. No results found for this or any previous visit. No results found for this or any previous visit. No results found for this or any previous visit. No results found for this or any previous visit. Results for orders placed during the hospital encounter of 11/25/15  CT  RENAL STONE STUDY   Narrative CLINICAL DATA:  Right flank pain for 2 days, decreased urine output  EXAM: CT ABDOMEN AND PELVIS WITHOUT CONTRAST  TECHNIQUE: Multidetector CT imaging of the abdomen and pelvis was performed following the standard protocol without IV contrast.  COMPARISON:  None.  FINDINGS: Lower chest: The lung bases are clear.  Hepatobiliary: The liver is unremarkable in the unenhanced state. No ductal dilatation is seen. No calcified gallstones are noted.  Pancreas: The pancreas is normal in size on this unenhanced study and the pancreatic duct is not dilated.  Spleen: The spleen is unremarkable.  Adrenals/Urinary Tract: The adrenal glands appear normal. There are bilateral renal calculi present. The largest cyst in the upper pole of the right kidney measuring 5 mm in diameter. No hydronephrosis is currently seen. However, there is a calcification which on multiplanar images appears to be with in the nondistended urinary bladder. This calculus measures 5 mm in diameter. The ureters are not dilated.  Stomach/Bowel: The stomach is largely decompressed. No dilatation of large or small bowel is seen. There is feces throughout the colon. Surgical clips are noted near the base of the cecum which may be due prior appendectomy.  Vascular/Lymphatic: The abdominal aorta is normal in caliber although are not well seen on this unenhanced study. No adenopathy is noted.  Reproductive: The uterus is not well seen on this unenhanced study. No definite adnexal lesion is noted. A small amount of free fluid layers in the cul de sac.  Other: None  Musculoskeletal: The lumbar vertebrae are in normal alignment with normal intervertebral disc spaces.  IMPRESSION: 1. There is a 5 mm calcification which appears to be within the urinary bladder consistent with a recently passed calculus. 2. Multiple bilateral renal calculi, the largest in the upper pole of the right kidney  measuring 5 mm in diameter. No present hydronephrosis. 3. Small amount of free fluid in the cul-de-sac.   Electronically Signed   By: Dwyane Dee M.D.   On: 11/25/2015 12:29     Assessment & Plan:   1. Nephrolithiasis - appear stable. Her discomfort improved but sounds musculoskeletal. Discussed dietary changes to prevent stones. F/u 1 year with KUB or sooner if issues.    - Urinalysis, Complete   No Follow-up on file.  Jerilee Field, MD  [facility] [address] [place], [place] [address] [phone]

## 2017-12-13 ENCOUNTER — Ambulatory Visit: Payer: BC Managed Care – PPO | Admitting: Urology

## 2018-02-21 ENCOUNTER — Telehealth: Payer: Self-pay | Admitting: Urology

## 2018-02-21 NOTE — Telephone Encounter (Signed)
-----   Message from Jerilee FieldMatthew Eskridge, MD sent at 02/20/2018 12:39 PM EST ----- Notify patient - overdue for f/u of kidney stones. See PA or MD next avail with KUB -- thanks  ----- Message ----- From: SYSTEM Sent: 02/20/2018  12:08 AM EST To: Jerilee FieldMatthew Eskridge, MD

## 2018-02-21 NOTE — Telephone Encounter (Signed)
Patient cx her last app in October. She stated that she did not want to be seen. She said she was not having any problems.   Mackenzie DusterMichelle

## 2018-03-09 NOTE — L&D Delivery Note (Signed)
Delivery Note  Date of delivery: 01/28/2019 Estimated Date of Delivery: 02/04/19 Patient's last menstrual period was 04/30/2018. EGA: [redacted]w[redacted]d  Delivery Note At 12:43 PM a viable female was delivered via Vaginal, Spontaneous (OA).  APGAR: 8, 9; weight  pending.  Placenta status:intact, Carolanne Grumbling.  Cord: 3 vessel cord, no nuchal.  Following complications: none.    Anesthesia:   Episiotomy: None Lacerations: None Suture Repair: n/a Est. Blood Loss (mL): 310  Mom to postpartum.  Baby to Couplet care / Skin to Skin.  First Stage: Labor onset: 0312 Augmentation : n/a Analgesia /Anesthesia intrapartum: none AROM at 1234  Second Stage: Complete dilation at 1215 Onset of pushing at 1234 Delivery at 1243 on 01/28/2019  She progressed to complete and had a spontaneous vaginal birth of a live female over an intact perineum. The fetal head was delivered in OA position with restitution to LOA no nuchal cord noted. Anterior then posterior shoulders delivered spontaneouslywith minimal assistance.  Dad requested to help "catch the baby" and was able to help.  Baby placed on mom's abdomen and attended to by transition RN. Cord clamped and cut when pulseless by Dad. Cord blood obtained for newborn labs.  Third Stage: Placenta delivered intact with 3VC Placenta disposition: n/a Uterine tone firm / bleeding minimal IV pitocin given for hemorrhage prophylaxis  No laceration identified  Est. Blood Loss (mL): 121  Complications: none  Mom to postpartum.  Baby to Couplet care / Skin to Skin.  Newborn: Birth Weight: pending due to skin to skin  Apgar Scores: 8/9 Feeding planned: breastfeeding   Clydene Laming, CNM 01/28/2019 1:07 PM

## 2018-07-28 LAB — OB RESULTS CONSOLE VARICELLA ZOSTER ANTIBODY, IGG: Varicella: IMMUNE

## 2018-07-28 LAB — OB RESULTS CONSOLE HIV ANTIBODY (ROUTINE TESTING): HIV: NONREACTIVE

## 2018-07-28 LAB — OB RESULTS CONSOLE RUBELLA ANTIBODY, IGM: Rubella: IMMUNE

## 2018-07-28 LAB — OB RESULTS CONSOLE GC/CHLAMYDIA
Chlamydia: NEGATIVE
Gonorrhea: NEGATIVE

## 2018-07-28 LAB — OB RESULTS CONSOLE RPR: RPR: NONREACTIVE

## 2018-07-28 LAB — OB RESULTS CONSOLE HEPATITIS B SURFACE ANTIGEN: Hepatitis B Surface Ag: NEGATIVE

## 2019-01-09 LAB — OB RESULTS CONSOLE GC/CHLAMYDIA
Chlamydia: NEGATIVE
Gonorrhea: NEGATIVE

## 2019-01-09 LAB — OB RESULTS CONSOLE GBS: GBS: NEGATIVE

## 2019-01-09 LAB — OB RESULTS CONSOLE HIV ANTIBODY (ROUTINE TESTING): HIV: NONREACTIVE

## 2019-01-28 ENCOUNTER — Other Ambulatory Visit: Payer: Self-pay

## 2019-01-28 ENCOUNTER — Inpatient Hospital Stay
Admission: EM | Admit: 2019-01-28 | Discharge: 2019-01-29 | DRG: 807 | Disposition: A | Discharge status: Home / Self Care | Payer: BC Managed Care – PPO | Attending: Certified Nurse Midwife | Admitting: Certified Nurse Midwife

## 2019-01-28 DIAGNOSIS — Z20828 Contact with and (suspected) exposure to other viral communicable diseases: Secondary | ICD-10-CM | POA: Diagnosis present

## 2019-01-28 DIAGNOSIS — Z87891 Personal history of nicotine dependence: Secondary | ICD-10-CM | POA: Diagnosis not present

## 2019-01-28 DIAGNOSIS — Z3A39 39 weeks gestation of pregnancy: Secondary | ICD-10-CM | POA: Diagnosis not present

## 2019-01-28 DIAGNOSIS — O26893 Other specified pregnancy related conditions, third trimester: Secondary | ICD-10-CM | POA: Diagnosis present

## 2019-01-28 LAB — CBC
HCT: 37.4 % (ref 36.0–46.0)
Hemoglobin: 12.5 g/dL (ref 12.0–15.0)
MCH: 28.2 pg (ref 26.0–34.0)
MCHC: 33.4 g/dL (ref 30.0–36.0)
MCV: 84.2 fL (ref 80.0–100.0)
Platelets: 146 10*3/uL — ABNORMAL LOW (ref 150–400)
RBC: 4.44 MIL/uL (ref 3.87–5.11)
RDW: 13.3 % (ref 11.5–15.5)
WBC: 14.6 10*3/uL — ABNORMAL HIGH (ref 4.0–10.5)
nRBC: 0 % (ref 0.0–0.2)

## 2019-01-28 LAB — TYPE AND SCREEN
ABO/RH(D): O POS
Antibody Screen: NEGATIVE

## 2019-01-28 LAB — SARS CORONAVIRUS 2 BY RT PCR (HOSPITAL ORDER, PERFORMED IN ~~LOC~~ HOSPITAL LAB): SARS Coronavirus 2: NEGATIVE

## 2019-01-28 MED ORDER — ACETAMINOPHEN 325 MG PO TABS
650.0000 mg | ORAL_TABLET | ORAL | Status: DC | PRN
  Administered 2019-01-28: 650 mg via ORAL
  Filled 2019-01-28: qty 2

## 2019-01-28 MED ORDER — MISOPROSTOL 200 MCG PO TABS
ORAL_TABLET | ORAL | Status: AC
  Filled 2019-01-28: qty 4

## 2019-01-28 MED ORDER — SENNOSIDES-DOCUSATE SODIUM 8.6-50 MG PO TABS
2.0000 | ORAL_TABLET | ORAL | Status: DC
  Administered 2019-01-28: 23:00:00 2 via ORAL
  Filled 2019-01-28: qty 2

## 2019-01-28 MED ORDER — OXYTOCIN BOLUS FROM INFUSION
500.0000 mL | Freq: Once | INTRAVENOUS | Status: AC
  Administered 2019-01-28: 500 mL via INTRAVENOUS

## 2019-01-28 MED ORDER — PRENATAL MULTIVITAMIN CH
1.0000 | ORAL_TABLET | Freq: Every day | ORAL | Status: DC
  Filled 2019-01-28: qty 1

## 2019-01-28 MED ORDER — TERBUTALINE SULFATE 1 MG/ML IJ SOLN: 0.2500 mg | Freq: Once | INTRAMUSCULAR | Status: DC | PRN

## 2019-01-28 MED ORDER — COCONUT OIL OIL: 1.0000 "application " | TOPICAL_OIL | Status: DC | PRN

## 2019-01-28 MED ORDER — IBUPROFEN 600 MG PO TABS
600.0000 mg | ORAL_TABLET | Freq: Four times a day (QID) | ORAL | Status: DC
  Administered 2019-01-28 – 2019-01-29 (×4): 600 mg via ORAL
  Filled 2019-01-28 (×5): qty 1

## 2019-01-28 MED ORDER — DIBUCAINE (PERIANAL) 1 % EX OINT: 1.0000 "application " | TOPICAL_OINTMENT | CUTANEOUS | Status: DC | PRN

## 2019-01-28 MED ORDER — OXYCODONE HCL 5 MG PO TABS: 5.0000 mg | ORAL_TABLET | ORAL | Status: DC | PRN

## 2019-01-28 MED ORDER — SIMETHICONE 80 MG PO CHEW: 80.0000 mg | CHEWABLE_TABLET | ORAL | Status: DC | PRN

## 2019-01-28 MED ORDER — OXYTOCIN 10 UNIT/ML IJ SOLN
INTRAMUSCULAR | Status: AC
  Filled 2019-01-28: qty 2

## 2019-01-28 MED ORDER — OXYCODONE-ACETAMINOPHEN 5-325 MG PO TABS: 2.0000 | ORAL_TABLET | ORAL | Status: DC | PRN

## 2019-01-28 MED ORDER — ONDANSETRON HCL 4 MG/2ML IJ SOLN: 4.0000 mg | Freq: Four times a day (QID) | INTRAMUSCULAR | Status: DC | PRN

## 2019-01-28 MED ORDER — LACTATED RINGERS IV SOLN
INTRAVENOUS | Status: DC
  Administered 2019-01-28: 08:00:00 via INTRAVENOUS

## 2019-01-28 MED ORDER — MEASLES, MUMPS & RUBELLA VAC IJ SOLR
0.5000 mL | Freq: Once | INTRAMUSCULAR | Status: DC
  Filled 2019-01-28: qty 0.5

## 2019-01-28 MED ORDER — OXYTOCIN 40 UNITS IN NORMAL SALINE INFUSION - SIMPLE MED
2.5000 [IU]/h | INTRAVENOUS | Status: DC
  Filled 2019-01-28: qty 1000

## 2019-01-28 MED ORDER — LIDOCAINE HCL (PF) 1 % IJ SOLN
30.0000 mL | INTRAMUSCULAR | Status: DC | PRN
  Filled 2019-01-28: qty 30

## 2019-01-28 MED ORDER — LACTATED RINGERS IV SOLN: 500.0000 mL | INTRAVENOUS | Status: DC | PRN

## 2019-01-28 MED ORDER — ACETAMINOPHEN 325 MG PO TABS: 650.0000 mg | ORAL_TABLET | ORAL | Status: DC | PRN

## 2019-01-28 MED ORDER — TETANUS-DIPHTH-ACELL PERTUSSIS 5-2.5-18.5 LF-MCG/0.5 IM SUSP
0.5000 mL | Freq: Once | INTRAMUSCULAR | Status: DC
  Filled 2019-01-28: qty 0.5

## 2019-01-28 MED ORDER — FERROUS SULFATE 325 (65 FE) MG PO TABS
325.0000 mg | ORAL_TABLET | Freq: Two times a day (BID) | ORAL | Status: DC
  Administered 2019-01-28 – 2019-01-29 (×2): 325 mg via ORAL
  Filled 2019-01-28 (×2): qty 1

## 2019-01-28 MED ORDER — WITCH HAZEL-GLYCERIN EX PADS: 1.0000 "application " | MEDICATED_PAD | CUTANEOUS | Status: DC | PRN

## 2019-01-28 MED ORDER — OXYCODONE-ACETAMINOPHEN 5-325 MG PO TABS: 1.0000 | ORAL_TABLET | ORAL | Status: DC | PRN

## 2019-01-28 MED ORDER — ONDANSETRON HCL 4 MG PO TABS: 4.0000 mg | ORAL_TABLET | ORAL | Status: DC | PRN

## 2019-01-28 MED ORDER — AMMONIA AROMATIC IN INHA
RESPIRATORY_TRACT | Status: AC
  Filled 2019-01-28: qty 10

## 2019-01-28 MED ORDER — SOD CITRATE-CITRIC ACID 500-334 MG/5ML PO SOLN: 30.0000 mL | ORAL | Status: DC | PRN

## 2019-01-28 MED ORDER — DOCUSATE SODIUM 100 MG PO CAPS
100.0000 mg | ORAL_CAPSULE | Freq: Two times a day (BID) | ORAL | Status: DC
  Administered 2019-01-29: 100 mg via ORAL
  Filled 2019-01-28: qty 1

## 2019-01-28 MED ORDER — ONDANSETRON HCL 4 MG/2ML IJ SOLN: 4.0000 mg | INTRAMUSCULAR | Status: DC | PRN

## 2019-01-28 MED ORDER — OXYTOCIN 40 UNITS IN NORMAL SALINE INFUSION - SIMPLE MED: 1.0000 m[IU]/min | INTRAVENOUS | Status: DC

## 2019-01-28 MED ORDER — BENZOCAINE-MENTHOL 20-0.5 % EX AERO
1.0000 "application " | INHALATION_SPRAY | CUTANEOUS | Status: DC | PRN
  Administered 2019-01-28: 1 via TOPICAL
  Filled 2019-01-28: qty 56

## 2019-01-28 MED ORDER — OXYCODONE HCL 5 MG PO TABS: 10.0000 mg | ORAL_TABLET | ORAL | Status: DC | PRN

## 2019-01-28 MED ORDER — DIPHENHYDRAMINE HCL 25 MG PO CAPS: 25.0000 mg | ORAL_CAPSULE | Freq: Four times a day (QID) | ORAL | Status: DC | PRN

## 2019-01-28 NOTE — H&P (Signed)
Mackenzie Ellis is a 33 y.o. female presenting for regular UCs  OB History    Gravida  3   Para  1   Term  1   Preterm  0   AB  1   Living  1     SAB  0   TAB  0   Ectopic  0   Multiple  0   Live Births  1          Past Medical History:  Diagnosis Date  . Migraine    Past Surgical History:  Procedure Laterality Date  . APPENDECTOMY    . CHOLECYSTECTOMY    . DILATION AND EVACUATION     EAB in second trimester   Family History: family history includes Diabetes in her father; Hematuria in her father; Nephrolithiasis in her father; Prostate cancer in her father. Social History:  reports that she quit smoking about 6 years ago. She has never used smokeless tobacco. She reports that she does not drink alcohol or use drugs.     Maternal Diabetes: No Genetic Screening: Declined Maternal Ultrasounds/Referrals: Normal Fetal Ultrasounds or other Referrals:  None Maternal Substance Abuse:  No Significant Maternal Medications:  None Significant Maternal Lab Results:  Group B Strep negative Other Comments:  None  Review of Systems  All other systems reviewed and are negative.  History Dilation: 7 Effacement (%): 100 Station: -1 Exam by:: J Topher Buenaventura CNM Blood pressure 125/87, temperature 99.1 F (37.3 C), temperature source Oral, resp. rate 18, height 5\' 7"  (1.702 m), weight 78 kg, last menstrual period 04/30/2018, currently breastfeeding. Maternal Exam:  Uterine Assessment: Contraction strength is moderate.  Contraction frequency is regular.   Abdomen: Patient reports no abdominal tenderness. Fetal presentation: vertex  Introitus: Normal vulva. Normal vagina.  Pelvis: adequate for delivery.   Cervix: Cervix evaluated by digital exam.     Physical Exam  Constitutional: She is oriented to person, place, and time. She appears well-developed and well-nourished.  HENT:  Head: Normocephalic.  Neck: Normal range of motion.  Cardiovascular: Normal rate and  regular rhythm.  Respiratory: Effort normal.  GI: Soft.  Genitourinary:    Vulva and vagina normal.   Musculoskeletal: Normal range of motion.  Neurological: She is alert and oriented to person, place, and time.  Skin: Skin is warm and dry.  Psychiatric: She has a normal mood and affect. Her behavior is normal.    Prenatal labs: ABO, Rh:  O pos Antibody:  neg Rubella:  Imm RPR:   neg HBsAg:   neg HIV:   neg GBS:   neg  Assessment/Plan: 1. Admit to Labor & Delivery; consents reviewed and obtained  2. Fetal Well being  - Fetal Tracing: Cat 1 - GBS: neg - Presentation: vxt confirmed by SVE   3. Routine OB: - Prenatal labs reviewed, as above - Rh pos - CBC & T&S on admit - Clear fluids, IVF  4. Monitoring of Labor -  Contractions regular external toco in place -  Pelvis adequate  -  Plan for continuous fetal monitoring  -  Maternal pain control as desired: nothing - Anticipate vaginal delivery  5. Post Partum Planning: - Infant feeding: breastfeeding - Contraception: POPs    Mackenzie Ellis 01/28/2019, 7:44 AM

## 2019-01-28 NOTE — Discharge Summary (Signed)
Obstetrical Discharge Summary  Patient Name: Mackenzie Ellis DOB: 30-Jun-1985 MRN: [id number]  Date of Admission: 01/28/2019 Date of Delivery: 01/28/2019 Delivered by: Mackenzie Ellis. Mackenzie Ellis CNM Date of Discharge: 01/29/2019  Primary OB: [facility]  LOV:[id number]'J last menstrual period was 04/30/2018. EDC Estimated Date of Delivery: 02/04/19 Gestational Age at Delivery: [redacted]w[redacted]d   Antepartum complications: none Admitting Diagnosis: Active labor Secondary Diagnosis: Patient Active Problem List   Diagnosis Date Noted  . NSVD (normal spontaneous vaginal delivery) 01/28/2019    Augmentation: none Complications: None  Intrapartum complications/course:  18AC G2P1011 at 39+0wks presenting with active labor, progressed quickly. AROM for clear fluid as pushing began, she pushed over an intact perineum and delivered the fetal head, followed promptly by the shoulders. She was in control the whole time, and the baby placed on the maternal abdomen. Delayed cord clamping and the FOB cut his cord, while he was skin to skin. The placenta delivered spontaneously and intact. No laceration noted. Mom and baby tolerated the procedure well.  Date of Delivery: 01/28/2019 Delivered By: Mackenzie Ellis CNM Delivery Type: spontaneous vaginal delivery Anesthesia: none Placenta: sponatneous Laceration: none Episiotomy: none Newborn Data: Live born female  Birth Weight:   APGAR: 19, 9  Newborn Delivery   Birth date/time: 01/28/2019 12:43:00 Delivery type: Vaginal, Spontaneous        Postpartum Procedures: none  Post partum course:  Patient had an uncomplicated postpartum course.  By time of discharge on PPD#1, her pain was controlled on oral pain medications; she had appropriate lochia and was ambulating, voiding without difficulty and tolerating regular diet.  She was deemed stable for discharge to home.     Discharge Physical Exam:  BP 124/88 (BP Location: Left Arm)   Pulse 88   Temp 98.3 F (36.8 C)  (Oral)   Resp 20   Ht 5\' 7"  (1.702 m)   Wt 78 kg   LMP 04/30/2018   SpO2 100%   Breastfeeding Unknown   BMI 26.94 kg/m   General: NAD CV: RRR Pulm: CTABL, nl effort ABD: s/nd/nt, fundus firm and below the umbilicus Lochia: minimal DVT Evaluation: LE non-ttp, no evidence of DVT on exam.  Hemoglobin  Date Value Ref Range Status  01/29/2019 11.0 (L) 12.0 - 15.0 g/dL Final   HCT  Date Value Ref Range Status  01/29/2019 34.3 (L) 36.0 - 46.0 % Final     Disposition: stable, discharge to home. Baby Feeding: breastmilk Baby Disposition: home with mom  Rh Immune globulin given: O pos Rubella vaccine given: Immune Tdap vaccine given in AP or PP setting: 11/10/2018 Flu vaccine given in AP or PP setting: may have received it at work  Contraception: POPs (hx of migraines)  Prenatal Labs:  MBT: O pos   Ab screen: Neg   Pap: 11/17/16 NILM Hr  HPV neg  HIV: Neg  Hep B: Neg RPR: NR    G/C:  Neg Rubella:  Immune   VZV: Immune UA culture: no growth   Plan:  Mackenzie Ellis was discharged to home in good condition. Follow-up appointment with delivering provider in 6 weeks.  Discharge Medications: Allergies as of 01/29/2019   No Known Allergies     Medication List    STOP taking these medications   Norlyda 0.35 MG tablet Generic drug: norethindrone     TAKE these medications   calcium carbonate 500 MG chewable tablet Commonly known as: TUMS - dosed in mg elemental calcium Chew 1 tablet by mouth daily.   ferrous sulfate 325 (  65 FE) MG tablet Take 1 tablet (325 mg total) by mouth daily with breakfast.   prenatal multivitamin Tabs tablet Take 1 tablet by mouth daily at 12 noon.       Follow-up Information    [facility]. Schedule an appointment as soon as possible for a visit in 6 week(s).   Contact information: [address] [place] [address] [phone]          Signed: Cyril Mourning, CNM 01/29/19 11:49 AM

## 2019-01-28 NOTE — Plan of Care (Signed)
Patient vaginally delivered 1243 viable female infant.  Patient ambulated to postpartum floor per her request.  Report to Mammie Lorenzo RN

## 2019-01-28 NOTE — OB Triage Note (Signed)
Pt is a 33y/o G2P1011 at [redacted]w[redacted]d with c/o ctx that begain at 3am and got more intense about 5am. Pt states pain is 5-8/10 on a pain scale. Pt states pain is mostly located in back and lower abdomen. Pt states +FM. Pt denies LOF, and VB. Monitors applied and assessing. Initial FHT 145.

## 2019-01-29 LAB — CBC
HCT: 34.3 % — ABNORMAL LOW (ref 36.0–46.0)
Hemoglobin: 11 g/dL — ABNORMAL LOW (ref 12.0–15.0)
MCH: 28.1 pg (ref 26.0–34.0)
MCHC: 32.1 g/dL (ref 30.0–36.0)
MCV: 87.5 fL (ref 80.0–100.0)
Platelets: 153 10*3/uL (ref 150–400)
RBC: 3.92 MIL/uL (ref 3.87–5.11)
RDW: 13.3 % (ref 11.5–15.5)
WBC: 15 10*3/uL — ABNORMAL HIGH (ref 4.0–10.5)
nRBC: 0 % (ref 0.0–0.2)

## 2019-01-29 LAB — RPR: RPR Ser Ql: NONREACTIVE

## 2019-01-29 MED ORDER — FERROUS SULFATE 325 (65 FE) MG PO TABS: 325.0000 mg | ORAL_TABLET | Freq: Every day | ORAL | 3 refills | Status: AC

## 2019-01-29 NOTE — Discharge Instructions (Signed)
Postpartum Baby Blues °The postpartum period begins right after the birth of a baby. During this time, there is often a lot of joy and excitement. It is also a time of many changes in the life of the parents. No matter how many times a mother gives birth, each child brings new challenges to the family, including different ways of relating to one another. °It is common to have feelings of excitement along with confusing changes in moods, emotions, and thoughts. You may feel happy one minute and sad or stressed the next. These feelings of sadness usually happen in the period right after you have your baby, and they go away within a week or two. This is called the "baby blues." °What are the causes? °There is no known cause of baby blues. It is likely caused by a combination of factors. However, changes in hormone levels after childbirth are believed to trigger some of the symptoms. °Other factors that can play a role in these mood changes include: °· Lack of sleep. °· Stressful life events, such as poverty, caring for a loved one, or death of a loved one. °· Genetics. °What are the signs or symptoms? °Symptoms of this condition include: °· Brief changes in mood, such as going from extreme happiness to sadness. °· Decreased concentration. °· Difficulty sleeping. °· Crying spells and tearfulness. °· Loss of appetite. °· Irritability. °· Anxiety. °If the symptoms of baby blues last for more than 2 weeks or become more severe, you may have postpartum depression. °How is this diagnosed? °This condition is diagnosed based on an evaluation of your symptoms. There are no medical or lab tests that lead to a diagnosis, but there are various questionnaires that a health care provider may use to identify women with the baby blues or postpartum depression. °How is this treated? °Treatment is not needed for this condition. The baby blues usually go away on their own in 1-2 weeks. Social support is often all that is needed. You will  be encouraged to get adequate sleep and rest. °Follow these instructions at home: °Lifestyle ° °  ° °· Get as much rest as you can. Take a nap when the baby sleeps. °· Exercise regularly as told by your health care provider. Some women find yoga and walking to be helpful. °· Eat a balanced and nourishing diet. This includes plenty of fruits and vegetables, whole grains, and lean proteins. °· Do little things that you enjoy. Have a cup of tea, take a bubble bath, read your favorite magazine, or listen to your favorite music. °· Avoid alcohol. °· Ask for help with household chores, cooking, grocery shopping, or running errands. Do not try to do everything yourself. Consider hiring a postpartum doula to help. This is a professional who specializes in providing support to new mothers. °· Try not to make any major life changes during pregnancy or right after giving birth. This can add stress. °General instructions °· Talk to people close to you about how you are feeling. Get support from your partner, family members, friends, or other new moms. You may want to join a support group. °· Find ways to cope with stress. This may include: °? Writing your thoughts and feelings in a journal. °? Spending time outside. °? Spending time with people who make you laugh. °· Try to stay positive in how you think. Think about the things you are grateful for. °· Take over-the-counter and prescription medicines only as told by your health care provider. °·   Let your health care provider know if you have any concerns. °· Keep all postpartum visits as told by your health care provider. This is important. °Contact a health care provider if: °· Your baby blues do not go away after 2 weeks. °Get help right away if: °· You have thoughts of taking your own life (suicidal thoughts). °· You think you may harm the baby or other people. °· You see or hear things that are not there (hallucinations). °Summary °· After giving birth, you may feel happy  one minute and sad or stressed the next. Feelings of sadness that happen right after the baby is born and go away after a week or two are called the "baby blues." °· You can manage the baby blues by getting enough rest, eating a healthy diet, exercising, spending time with supportive people, and finding ways to cope with stress. °· If feelings of sadness and stress last longer than 2 weeks or get in the way of caring for your baby, talk to your health care provider. This may mean you have postpartum depression. °This information is not intended to replace advice given to you by your health care provider. Make sure you discuss any questions you have with your health care provider. °Document Released: 11/28/2003 Document Revised: 06/17/2018 Document Reviewed: 04/21/2016 °Elsevier Patient Education © 2020 [employer] °Postpartum Care After Vaginal Delivery °This sheet gives you information about how to care for yourself from the time you deliver your baby to up to 6-12 weeks after delivery (postpartum period). Your health care provider may also give you more specific instructions. If you have problems or questions, contact your health care provider. °Follow these instructions at home: °Vaginal bleeding °· It is normal to have vaginal bleeding (lochia) after delivery. Wear a sanitary pad for vaginal bleeding and discharge. °? During the first week after delivery, the amount and appearance of lochia is often similar to a menstrual period. °? Over the next few weeks, it will gradually decrease to a dry, yellow-brown discharge. °? For most women, lochia stops completely by 4-6 weeks after delivery. Vaginal bleeding can vary from woman to woman. °· Change your sanitary pads frequently. Watch for any changes in your flow, such as: °? A sudden increase in volume. °? A change in color. °? Large blood clots. °· If you pass a blood clot from your vagina, save it and call your health care provider to discuss. Do not flush blood  clots down the toilet before talking with your health care provider. °· Do not use tampons or douches until your health care provider says this is safe. °· If you are not breastfeeding, your period should return 6-8 weeks after delivery. If you are feeding your child breast milk only (exclusive breastfeeding), your period may not return until you stop breastfeeding. °Perineal care °· Keep the area between the vagina and the anus (perineum) clean and dry as told by your health care provider. Use medicated pads and pain-relieving sprays and creams as directed. °· If you had a cut in the perineum (episiotomy) or a tear in the vagina, check the area for signs of infection until you are healed. Check for: °? More redness, swelling, or pain. °? Fluid or blood coming from the cut or tear. °? Warmth. °? Pus or a bad smell. °· You may be given a squirt bottle to use instead of wiping to clean the perineum area after you go to the bathroom. As you start healing, you may use   the squirt bottle before wiping yourself. Make sure to wipe gently. °· To relieve pain caused by an episiotomy, a tear in the vagina, or swollen veins in the anus (hemorrhoids), try taking a warm sitz bath 2-3 times a day. A sitz bath is a warm water bath that is taken while you are sitting down. The water should only come up to your hips and should cover your buttocks. °Breast care °· Within the first few days after delivery, your breasts may feel heavy, full, and uncomfortable (breast engorgement). Milk may also leak from your breasts. Your health care provider can suggest ways to help relieve the discomfort. Breast engorgement should go away within a few days. °· If you are breastfeeding: °? Wear a bra that supports your breasts and fits you well. °? Keep your nipples clean and dry. Apply creams and ointments as told by your health care provider. °? You may need to use breast pads to absorb milk that leaks from your breasts. °? You may have uterine  contractions every time you breastfeed for up to several weeks after delivery. Uterine contractions help your uterus return to its normal size. °? If you have any problems with breastfeeding, work with your health care provider or lactation consultant. °· If you are not breastfeeding: °? Avoid touching your breasts a lot. Doing this can make your breasts produce more milk. °? Wear a good-fitting bra and use cold packs to help with swelling. °? Do not squeeze out (express) milk. This causes you to make more milk. °Intimacy and sexuality °· Ask your health care provider when you can engage in sexual activity. This may depend on: °? Your risk of infection. °? How fast you are healing. °? Your comfort and desire to engage in sexual activity. °· You are able to get pregnant after delivery, even if you have not had your period. If desired, talk with your health care provider about methods of birth control (contraception). °Medicines °· Take over-the-counter and prescription medicines only as told by your health care provider. °· If you were prescribed an antibiotic medicine, take it as told by your health care provider. Do not stop taking the antibiotic even if you start to feel better. °Activity °· Gradually return to your normal activities as told by your health care provider. Ask your health care provider what activities are safe for you. °· Rest as much as possible. Try to rest or take a nap while your baby is sleeping. °Eating and drinking ° °· Drink enough fluid to keep your urine pale yellow. °· Eat high-fiber foods every day. These may help prevent or relieve constipation. High-fiber foods include: °? Whole grain cereals and breads. °? Brown rice. °? Beans. °? Fresh fruits and vegetables. °· Do not try to lose weight quickly by cutting back on calories. °· Take your prenatal vitamins until your postpartum checkup or until your health care provider tells you it is okay to stop. °Lifestyle °· Do not use any products  that contain nicotine or tobacco, such as cigarettes and e-cigarettes. If you need help quitting, ask your health care provider. °· Do not drink alcohol, especially if you are breastfeeding. °General instructions °· Keep all follow-up visits for you and your baby as told by your health care provider. Most women visit their health care provider for a postpartum checkup within the first 3-6 weeks after delivery. °Contact a health care provider if: °· You feel unable to cope with the changes that your child brings   to your life, and these feelings do not go away. °· You feel unusually sad or worried. °· Your breasts become red, painful, or hard. °· You have a fever. °· You have trouble holding urine or keeping urine from leaking. °· You have little or no interest in activities you used to enjoy. °· You have not breastfed at all and you have not had a menstrual period for 12 weeks after delivery. °· You have stopped breastfeeding and you have not had a menstrual period for 12 weeks after you stopped breastfeeding. °· You have questions about caring for yourself or your baby. °· You pass a blood clot from your vagina. °Get help right away if: °· You have chest pain. °· You have difficulty breathing. °· You have sudden, severe leg pain. °· You have severe pain or cramping in your lower abdomen. °· You bleed from your vagina so much that you fill more than one sanitary pad in one hour. Bleeding should not be heavier than your heaviest period. °· You develop a severe headache. °· You faint. °· You have blurred vision or spots in your vision. °· You have bad-smelling vaginal discharge. °· You have thoughts about hurting yourself or your baby. °If you ever feel like you may hurt yourself or others, or have thoughts about taking your own life, get help right away. You can go to the nearest emergency department or call: °· Your local emergency services (911 in the [place] °· A suicide crisis helpline, such as the National Suicide  Prevention Lifeline at 1-800-273-8255. This is open 24 hours a day. °Summary °· The period of time right after you deliver your newborn up to 6-12 weeks after delivery is called the postpartum period. °· Gradually return to your normal activities as told by your health care provider. °· Keep all follow-up visits for you and your baby as told by your health care provider. °This information is not intended to replace advice given to you by your health care provider. Make sure you discuss any questions you have with your health care provider. °Document Released: 12/21/2006 Document Revised: 02/26/2017 Document Reviewed: 12/07/2016 °Elsevier Patient Education © 2020 [employer] Inc. ° °

## 2019-01-29 NOTE — Progress Notes (Signed)
Discharge instructions, prescriptions, education, and appointments given and explained. Pt verbalized understanding with no further questions. Pt wheeled to personal vehicle via staff 

## 2020-07-16 ENCOUNTER — Other Ambulatory Visit: Payer: Self-pay | Admitting: Family Medicine

## 2020-07-16 DIAGNOSIS — N63 Unspecified lump in unspecified breast: Secondary | ICD-10-CM

## 2020-07-18 ENCOUNTER — Ambulatory Visit
Admission: RE | Admit: 2020-07-18 | Discharge: 2020-07-18 | Disposition: A | Discharge status: Home / Self Care | Payer: BC Managed Care – PPO | Source: Ambulatory Visit | Attending: Family Medicine | Admitting: Family Medicine

## 2020-07-18 ENCOUNTER — Other Ambulatory Visit: Payer: Self-pay

## 2020-07-18 DIAGNOSIS — N63 Unspecified lump in unspecified breast: Secondary | ICD-10-CM

## 2021-04-07 ENCOUNTER — Encounter: Payer: Self-pay | Admitting: Emergency Medicine

## 2021-04-07 ENCOUNTER — Other Ambulatory Visit: Payer: Self-pay

## 2021-04-07 DIAGNOSIS — R519 Headache, unspecified: Secondary | ICD-10-CM | POA: Insufficient documentation

## 2021-04-07 DIAGNOSIS — Y92410 Unspecified street and highway as the place of occurrence of the external cause: Secondary | ICD-10-CM | POA: Insufficient documentation
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
## 2021-04-08 ENCOUNTER — Emergency Department
Admission: EM | Admit: 2021-04-08 | Discharge: 2021-04-08 | Disposition: A | Discharge status: Home / Self Care | Payer: BC Managed Care – PPO | Attending: Emergency Medicine | Admitting: Emergency Medicine

## 2021-04-08 NOTE — Discharge Instructions (Signed)
You may alternate Tylenol 1000 mg every 6 hours as needed for pain, fever and Ibuprofen 800 mg every 6-8 hours as needed for pain, fever.  Please take Ibuprofen with food.  Do not take more than 4000 mg of Tylenol (acetaminophen) in a 24 hour period. ° °

## 2021-04-08 NOTE — ED Notes (Signed)
Pt discharge information reviewed. Pt understands need for follow up care and when to return if symptoms worsen. All questions answered. Pt is alert and oriented with even and regular respirations. Pt is seen ambulating out of department with string steady gait.   
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
## 2023-03-23 IMAGING — MG DIGITAL DIAGNOSTIC BILAT W/ TOMO W/ CAD
6 of 10 series · 6 of 30 positions shown · non-contrast
Comparison: None.

CLINICAL DATA: 34-year-old female with lump in the LOWER INNER
RIGHT breast discovered on self-examination, which is significantly
decreased in size recently. Patient denies redness or fever.
Baseline mammogram.

EXAM:
DIGITAL DIAGNOSTIC BILATERAL MAMMOGRAM WITH TOMOSYNTHESIS AND CAD;
ULTRASOUND RIGHT BREAST LIMITED
TECHNIQUE: Bilateral digital diagnostic mammography and breast tomosynthesis
was performed. The images were evaluated with computer-aided
detection.; Targeted ultrasound examination of the right breast was
performed

[R MLO synth-2D]
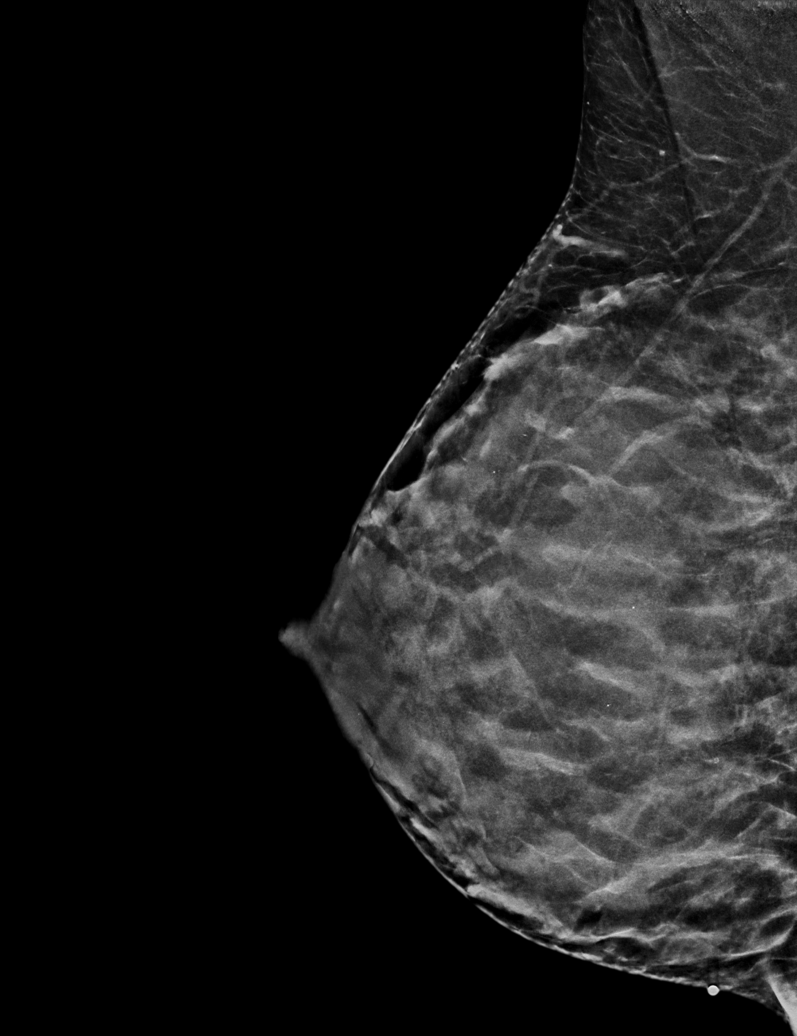

[R CC synth-2D]
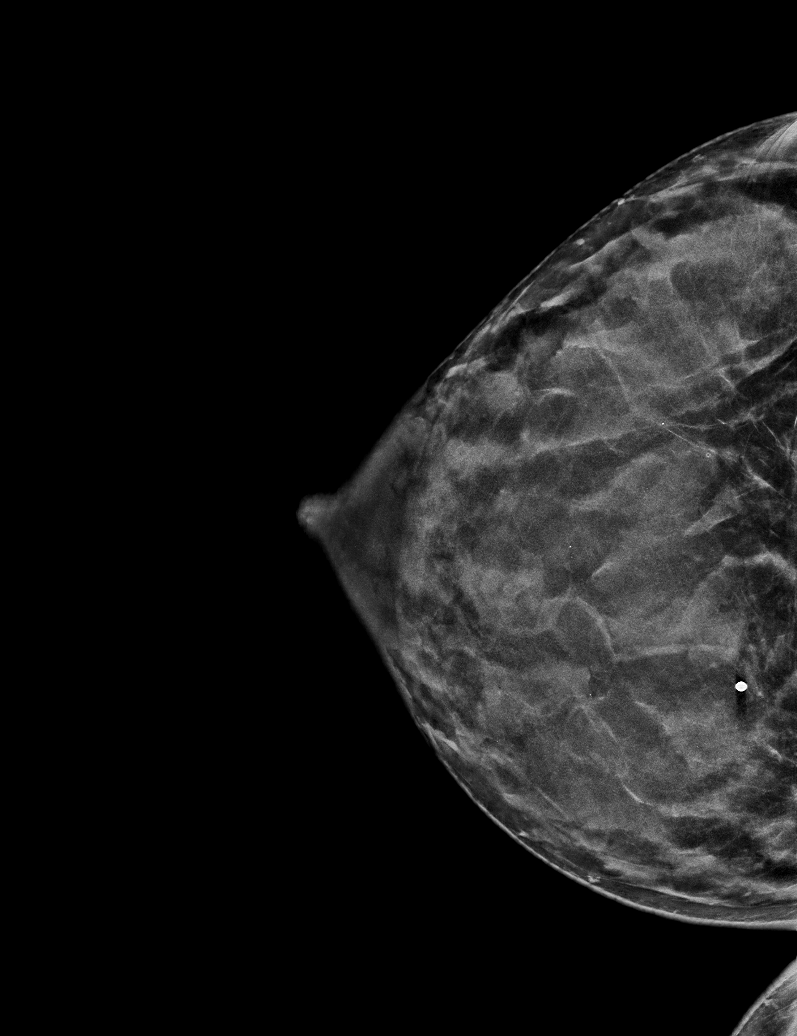

[L CC synth-2D]
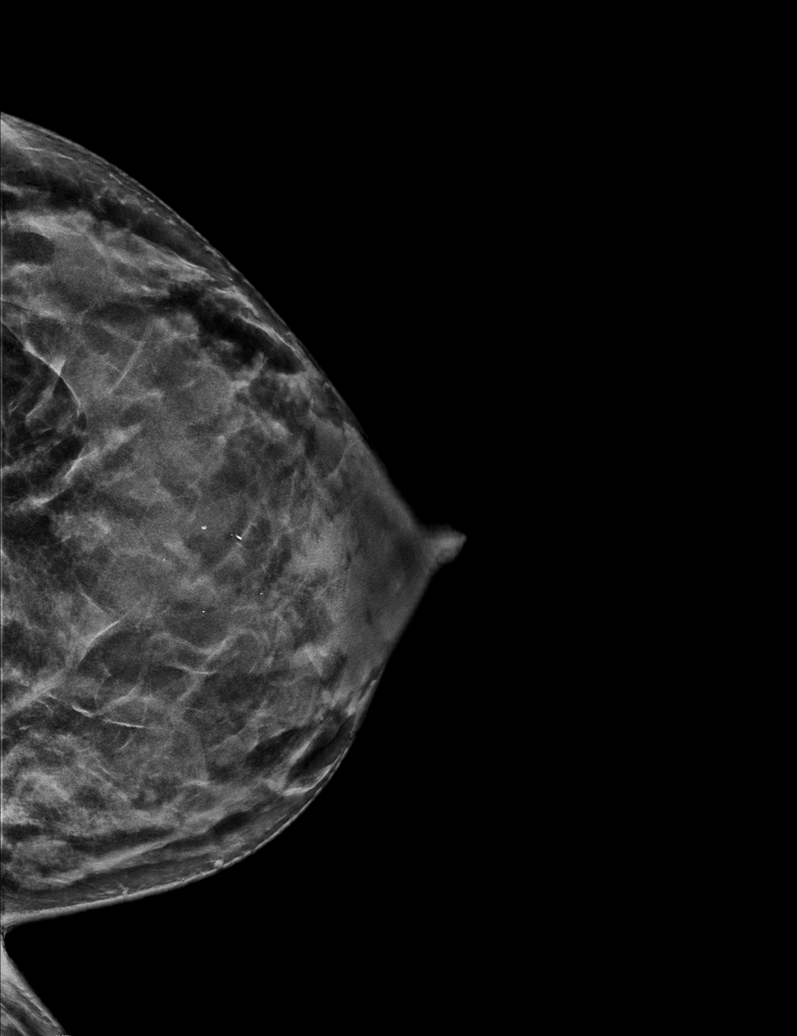

[L MLO synth-2D]
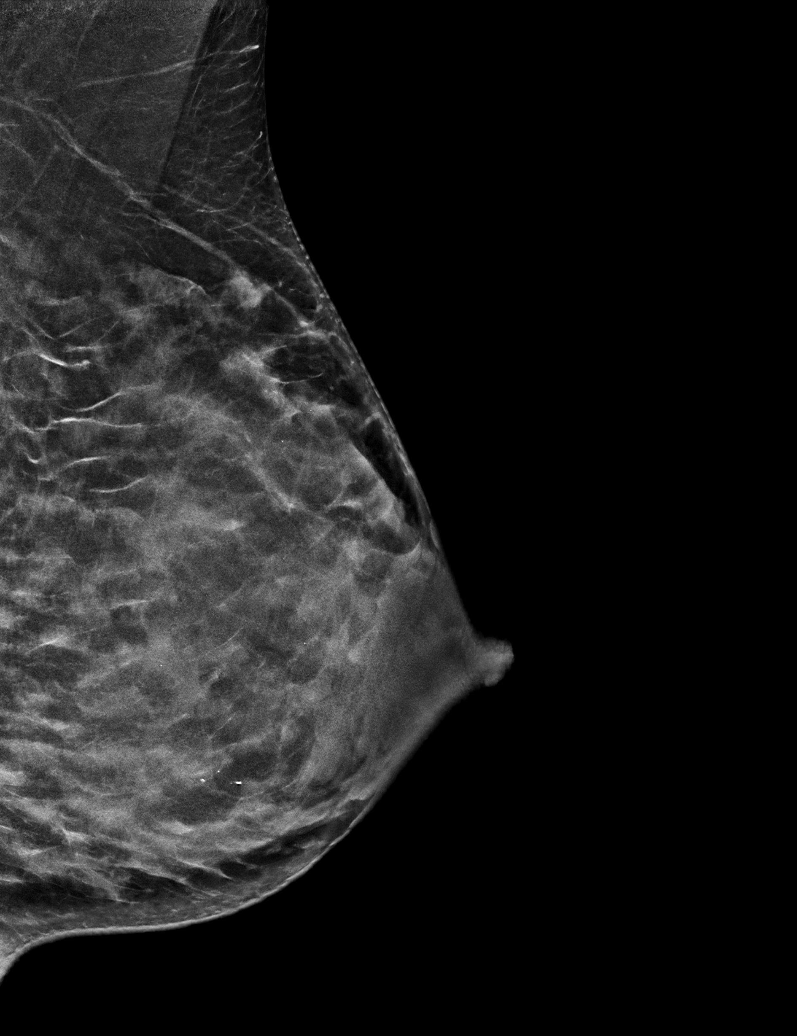

[R TAN synth-2D]
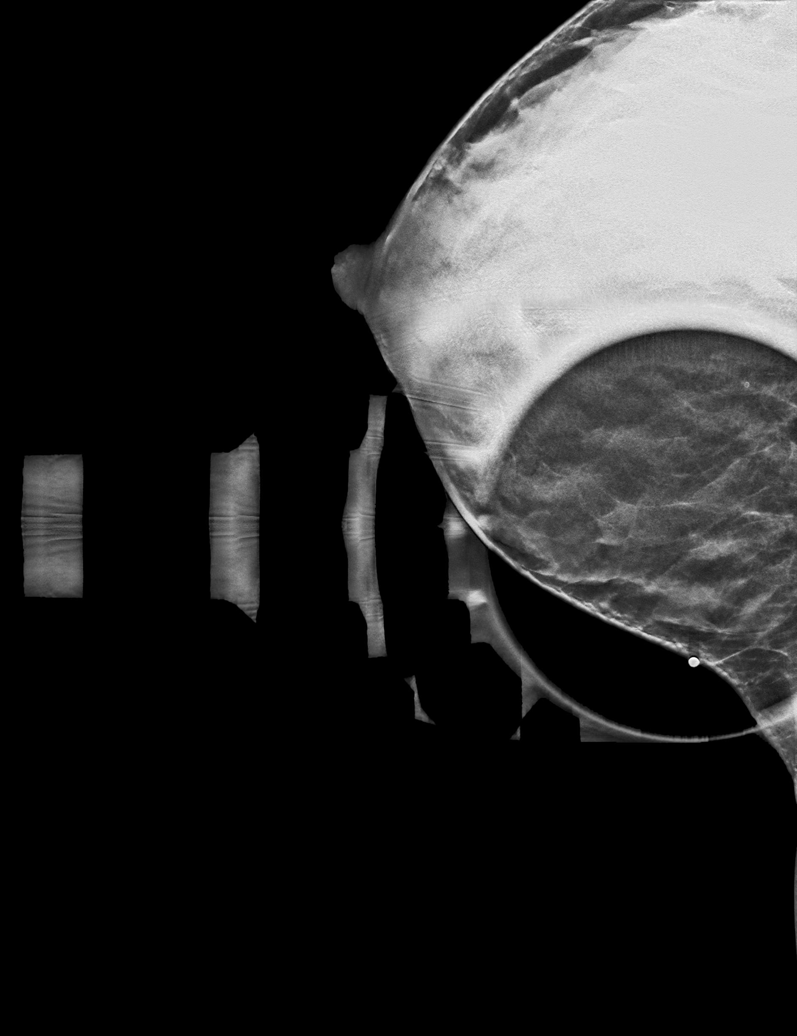

[L MLO tomo · tomo slice 27/53.0]
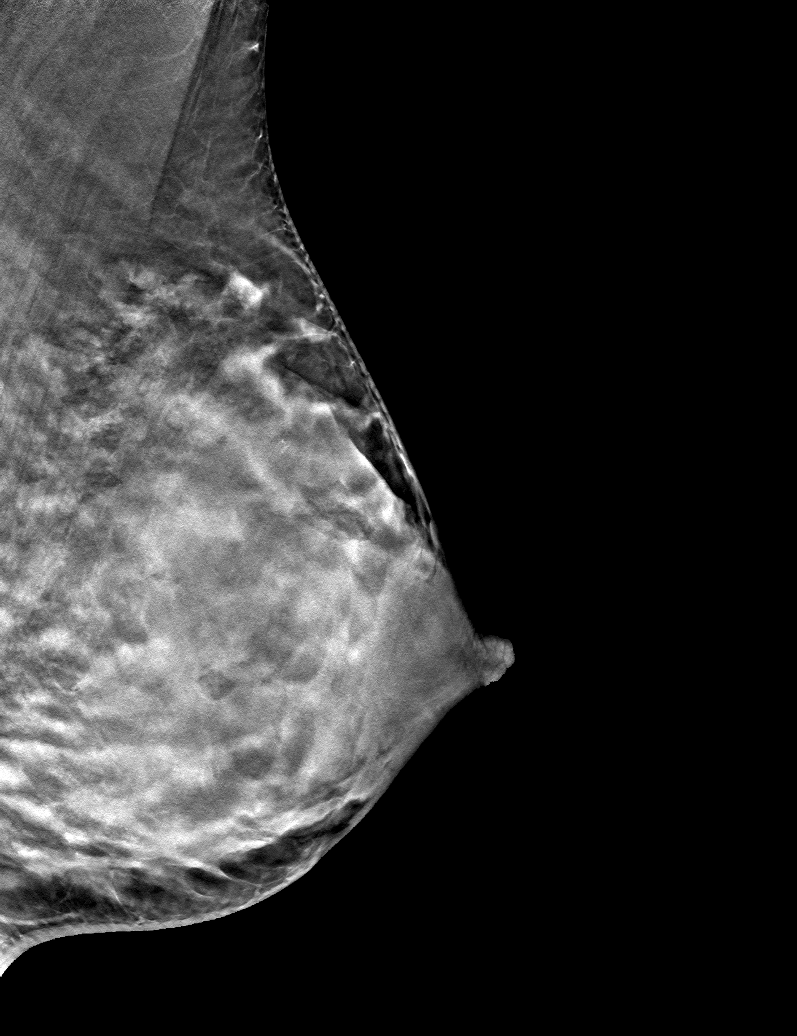

[6 of 30 positions shown; findings below may reference images not displayed]

ACR Breast Density Category d: The breast tissue is extremely dense,
which lowers the sensitivity of mammography.
FINDINGS: 2D/3D full field views of both breasts and a spot compression view
of the RIGHT breast demonstrate no suspicious mass, distortion or
worrisome calcifications.

On physical exam, a firm palpable mobile mass identified at the 5
o'clock position of the RIGHT breast 10 cm from the nipple. No
overlying erythema noted.

Targeted ultrasound is performed, showing a 0.5 x 0.3 x 0.7 cm
complicated cyst with internal mobile echoes at the 5 o'clock
position of the RIGHT breast 10 cm from the nipple, corresponding to
the palpable abnormality.
IMPRESSION: 1. Benign cyst within the LOWER INNER RIGHT breast corresponding to
the patient's palpable abnormality.
2. No suspicious mammographic findings within either breast.

RECOMMENDATION:
Bilateral screening mammogram at age 38.

I have discussed the findings and recommendations with the patient.
If applicable, a reminder letter will be sent to the patient
regarding the next appointment.

BI-RADS CATEGORY  2: Benign.

## 2023-03-23 IMAGING — US US BREAST*R* LIMITED INC AXILLA
1 series · 6 of 6 positions shown · non-contrast
Comparison: None.

CLINICAL DATA: 34-year-old female with lump in the LOWER INNER
RIGHT breast discovered on self-examination, which is significantly
decreased in size recently. Patient denies redness or fever.
Baseline mammogram.

EXAM:
DIGITAL DIAGNOSTIC BILATERAL MAMMOGRAM WITH TOMOSYNTHESIS AND CAD;
ULTRASOUND RIGHT BREAST LIMITED
TECHNIQUE: Bilateral digital diagnostic mammography and breast tomosynthesis
was performed. The images were evaluated with computer-aided
detection.; Targeted ultrasound examination of the right breast was
performed

[Series 1: us breast*right* limited inc axilla · 0.03mm/px · 6 of 6 slices shown]
[im 1/6]
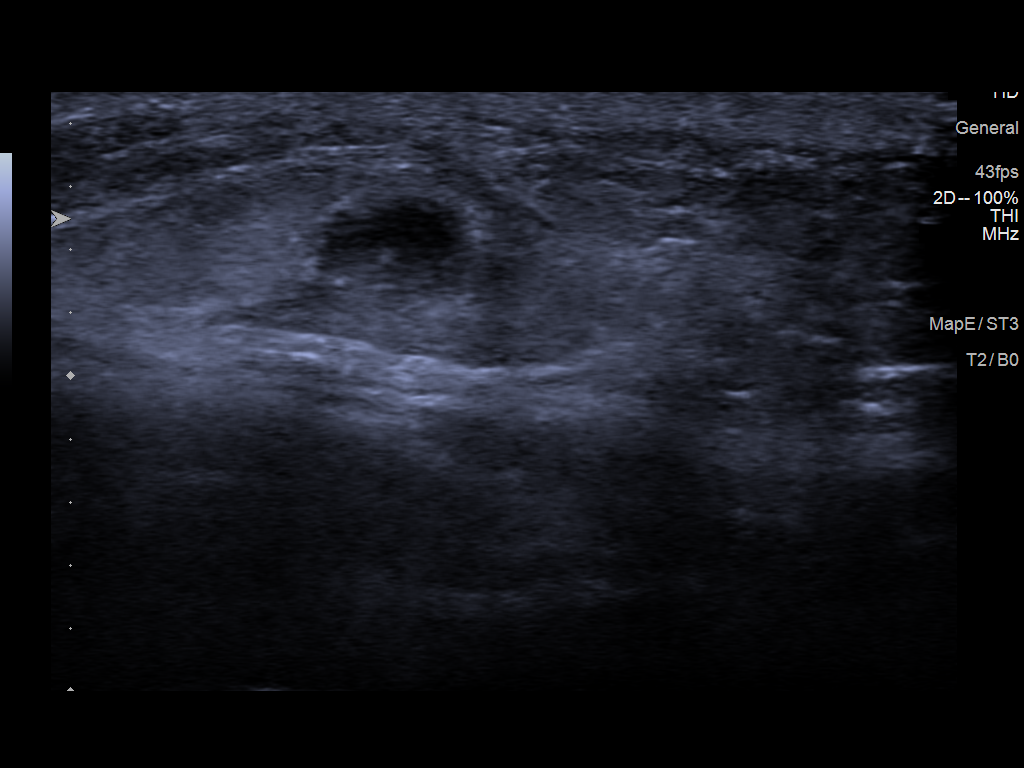
[im 2/6]
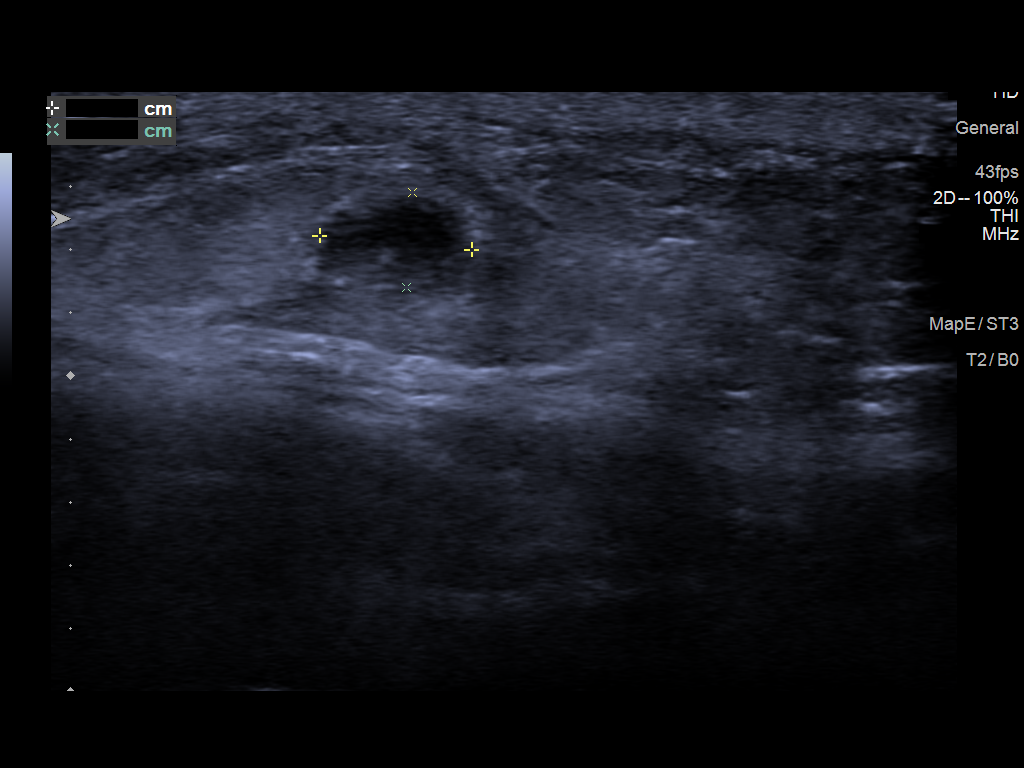
[im 3/6]
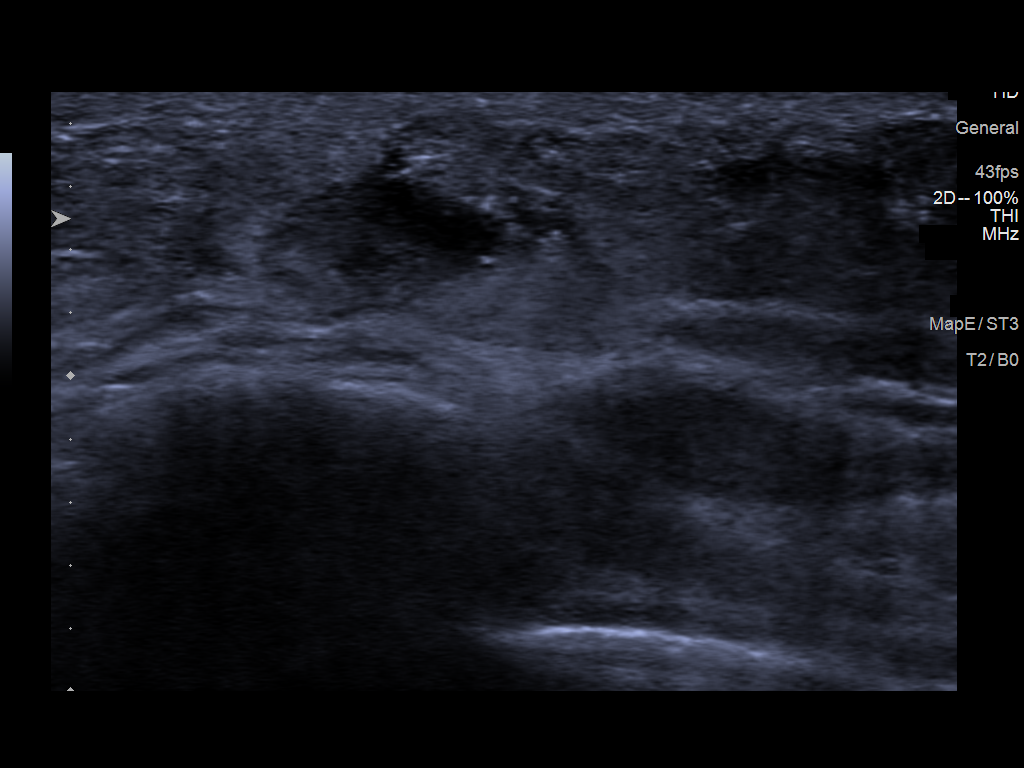
[im 4/6]
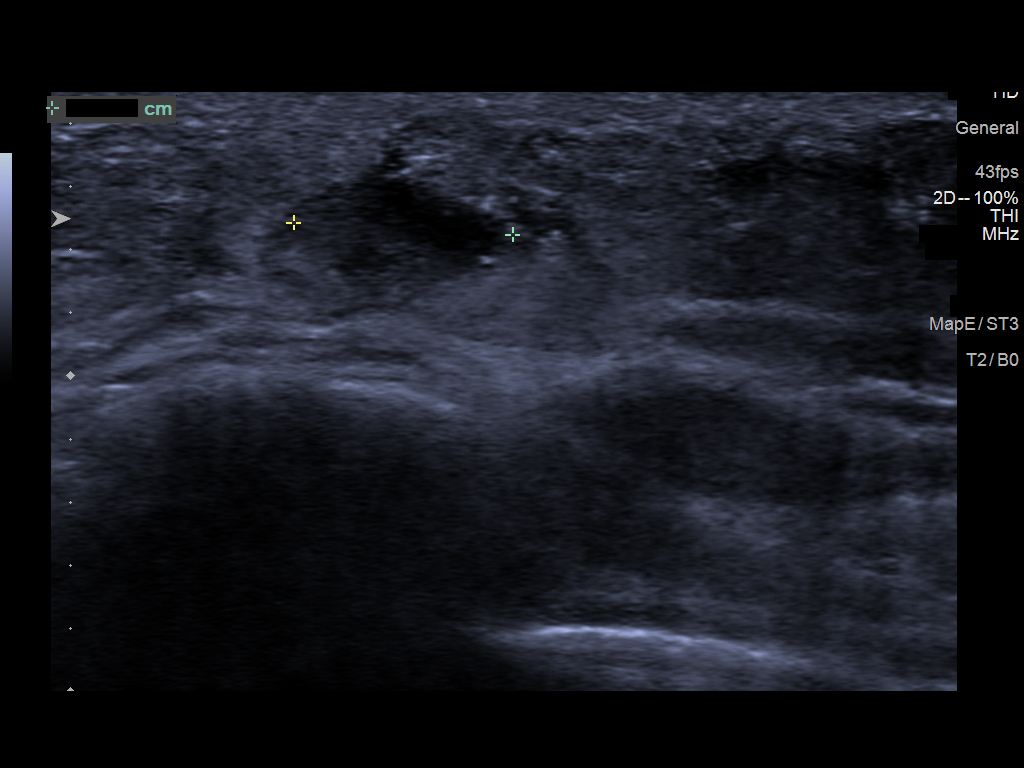
[im 5/6]
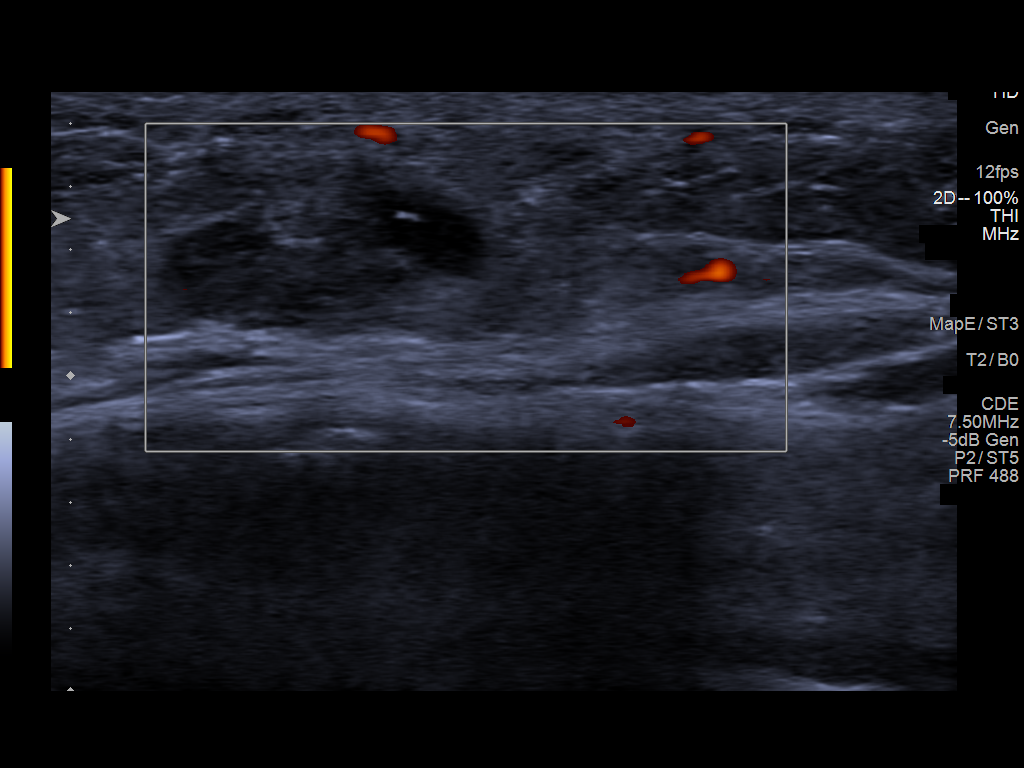
[im 6/6]
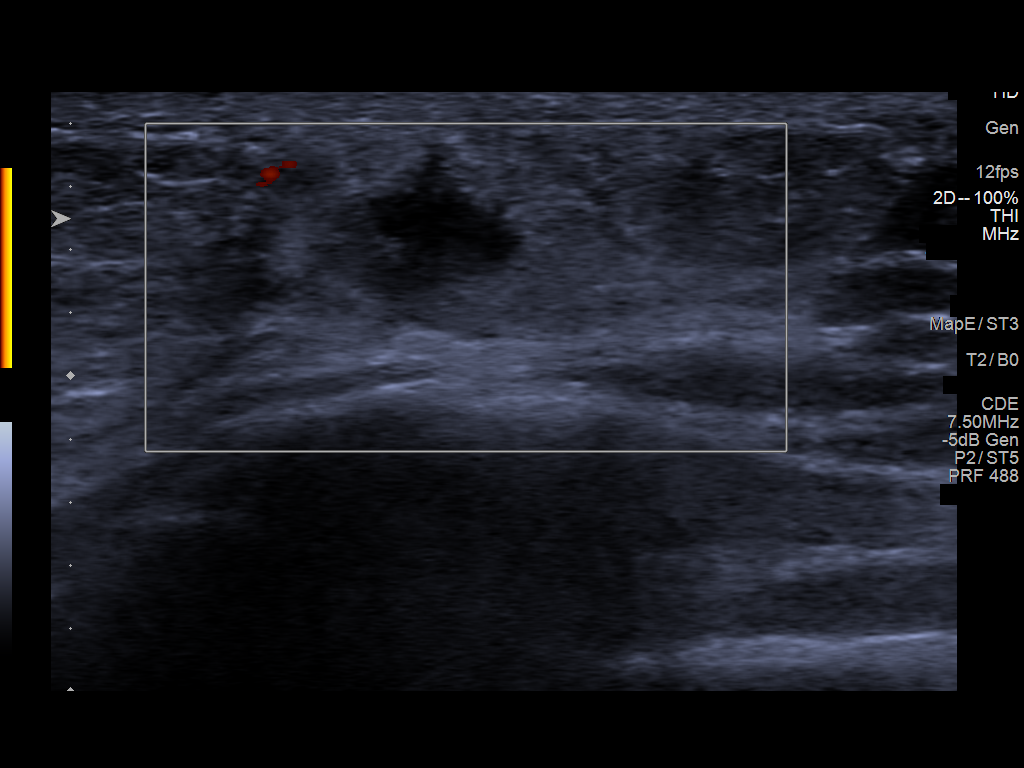

[6 of 6 positions shown; findings below may reference images not displayed]

ACR Breast Density Category d: The breast tissue is extremely dense,
which lowers the sensitivity of mammography.
FINDINGS: 2D/3D full field views of both breasts and a spot compression view
of the RIGHT breast demonstrate no suspicious mass, distortion or
worrisome calcifications.

On physical exam, a firm palpable mobile mass identified at the 5
o'clock position of the RIGHT breast 10 cm from the nipple. No
overlying erythema noted.

Targeted ultrasound is performed, showing a 0.5 x 0.3 x 0.7 cm
complicated cyst with internal mobile echoes at the 5 o'clock
position of the RIGHT breast 10 cm from the nipple, corresponding to
the palpable abnormality.
IMPRESSION: 1. Benign cyst within the LOWER INNER RIGHT breast corresponding to
the patient's palpable abnormality.
2. No suspicious mammographic findings within either breast.

RECOMMENDATION:
Bilateral screening mammogram at age 38.

I have discussed the findings and recommendations with the patient.
If applicable, a reminder letter will be sent to the patient
regarding the next appointment.

BI-RADS CATEGORY  2: Benign.
